# Patient Record
Sex: Female | Born: 1995 | Race: Black or African American | Hispanic: No | Marital: Single | Smoking: Never smoker
Health system: Southern US, Community
[De-identification: ages and names within clinical notes are randomized; demographics above are authoritative.]

## PROBLEM LIST (undated history)

## (undated) DIAGNOSIS — J45909 Unspecified asthma, uncomplicated: Secondary | ICD-10-CM

## (undated) DIAGNOSIS — J302 Other seasonal allergic rhinitis: Secondary | ICD-10-CM

## (undated) HISTORY — PX: NO PAST SURGERIES: SHX2092

---
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
## 2016-04-06 ENCOUNTER — Ambulatory Visit (INDEPENDENT_AMBULATORY_CARE_PROVIDER_SITE_OTHER): Payer: BLUE CROSS/BLUE SHIELD | Admitting: Family Medicine

## 2016-04-06 ENCOUNTER — Other Ambulatory Visit: Payer: Self-pay | Admitting: Family Medicine

## 2016-04-06 ENCOUNTER — Encounter: Payer: Self-pay | Admitting: Family Medicine

## 2016-04-06 VITALS — BP 110/72 | HR 79 | Ht 62.5 in | Wt 121.4 lb

## 2016-04-06 DIAGNOSIS — Z113 Encounter for screening for infections with a predominantly sexual mode of transmission: Secondary | ICD-10-CM

## 2016-04-06 DIAGNOSIS — Z0000 Encounter for general adult medical examination without abnormal findings: Secondary | ICD-10-CM

## 2016-04-06 DIAGNOSIS — J4520 Mild intermittent asthma, uncomplicated: Secondary | ICD-10-CM | POA: Diagnosis not present

## 2016-04-06 LAB — CBC WITH DIFFERENTIAL/PLATELET
Basophils Absolute: 54 cells/uL (ref 0–200)
Basophils Relative: 1 %
Eosinophils Absolute: 270 cells/uL (ref 15–500)
Eosinophils Relative: 5 %
HCT: 37.3 % (ref 35.0–45.0)
Hemoglobin: 12.6 g/dL (ref 11.7–15.5)
Lymphocytes Relative: 44 %
Lymphs Abs: 2376 cells/uL (ref 850–3900)
MCH: 32.2 pg (ref 27.0–33.0)
MCHC: 33.8 g/dL (ref 32.0–36.0)
MCV: 95.4 fL (ref 80.0–100.0)
MPV: 10.4 fL (ref 7.5–12.5)
Monocytes Absolute: 324 cells/uL (ref 200–950)
Monocytes Relative: 6 %
Neutro Abs: 2376 cells/uL (ref 1500–7800)
Neutrophils Relative %: 44 %
Platelets: 311 10*3/uL (ref 140–400)
RBC: 3.91 MIL/uL (ref 3.80–5.10)
RDW: 12.3 % (ref 11.0–15.0)
WBC: 5.4 10*3/uL (ref 4.0–10.5)

## 2016-04-06 LAB — LIPID PANEL
Cholesterol: 159 mg/dL (ref 125–170)
HDL: 59 mg/dL (ref 36–76)
LDL Cholesterol: 87 mg/dL (ref <110)
Total CHOL/HDL Ratio: 2.7 Ratio (ref <=5.0)
Triglycerides: 63 mg/dL (ref 40–136)
VLDL: 13 mg/dL (ref <30)

## 2016-04-06 LAB — HIV ANTIBODY (ROUTINE TESTING W REFLEX): HIV 1&2 Ab, 4th Generation: NONREACTIVE

## 2016-04-06 LAB — POCT URINALYSIS DIPSTICK
Bilirubin, UA: NEGATIVE
Glucose, UA: NEGATIVE
Ketones, UA: NEGATIVE
Leukocytes, UA: NEGATIVE
Nitrite, UA: NEGATIVE
Protein, UA: NEGATIVE
Spec Grav, UA: >=1.03
Urobilinogen, UA: NEGATIVE
pH, UA: 6

## 2016-04-06 MED ORDER — ALBUTEROL SULFATE HFA 108 (90 BASE) MCG/ACT IN AERS: 2.0000 | INHALATION_SPRAY | Freq: Four times a day (QID) | RESPIRATORY_TRACT | 1 refills | Status: DC | PRN

## 2016-04-06 NOTE — Patient Instructions (Addendum)
Return if you are needing your albuterol inhaler more than 2 times per month during the day or more than once at nighttime. This may mean that your asthma in not well controlled and we need to add another medication.    Preventative Care for Adults - Female      MAINTAIN REGULAR HEALTH EXAMS:  A routine yearly physical is a good way to check in with your primary care provider about your health and preventive screening. It is also an opportunity to share updates about your health and any concerns you have, and receive a thorough all-over exam.   Most health insurance companies pay for at least some preventative services.  Check with your health plan for specific coverages.  WHAT PREVENTATIVE SERVICES DO WOMEN NEED?  Adult women should have their weight and blood pressure checked regularly.   Women age 535 and older should have their cholesterol levels checked regularly.  Women should be screened for cervical cancer with a Pap smear and pelvic exam beginning at either age 19, or 3 years after they become sexually activity.    Breast cancer screening generally begins at age 740 with a mammogram and breast exam by your primary care provider.    Beginning at age 19 and continuing to age 19, women should be screened for colorectal cancer.  Certain people may need continued testing until age 19.  Updating vaccinations is part of preventative care.  Vaccinations help protect against diseases such as the flu.  Osteoporosis is a disease in which the bones lose minerals and strength as we age. Women ages 4365 and over should discuss this with their caregivers, as should women after menopause who have other risk factors.  Lab tests are generally done as part of preventative care to screen for anemia and blood disorders, to screen for problems with the kidneys and liver, to screen for bladder problems, to check blood sugar, and to check your cholesterol level.  Preventative services generally include  counseling about diet, exercise, avoiding tobacco, drugs, excessive alcohol consumption, and sexually transmitted infections.    GENERAL RECOMMENDATIONS FOR GOOD HEALTH:  Healthy diet:  Eat a variety of foods, including fruit, vegetables, animal or vegetable protein, such as meat, fish, chicken, and eggs, or beans, lentils, tofu, and grains, such as rice.  Drink plenty of water daily.  Decrease saturated fat in the diet, avoid lots of red meat, processed foods, sweets, fast foods, and fried foods.  Exercise:  Aerobic exercise helps maintain good heart health. At least 30-40 minutes of moderate-intensity exercise is recommended. For example, a brisk walk that increases your heart rate and breathing. This should be done on most days of the week.   Find a type of exercise or a variety of exercises that you enjoy so that it becomes a part of your daily life.  Examples are running, walking, swimming, water aerobics, and biking.  For motivation and support, explore group exercise such as aerobic class, spin class, Zumba, Yoga,or  martial arts, etc.    Set exercise goals for yourself, such as a certain weight goal, walk or run in a race such as a 5k walk/run.  Speak to your primary care provider about exercise goals.  Disease prevention:  If you smoke or chew tobacco, find out from your caregiver how to quit. It can literally save your life, no matter how long you have been a tobacco user. If you do not use tobacco, never begin.   Maintain a healthy diet and  normal weight. Increased weight leads to problems with blood pressure and diabetes.   The Body Mass Index or BMI is a way of measuring how much of your body is fat. Having a BMI above 27 increases the risk of heart disease, diabetes, hypertension, stroke and other problems related to obesity. Your caregiver can help determine your BMI and based on it develop an exercise and dietary program to help you achieve or maintain this important  measurement at a healthful level.  High blood pressure causes heart and blood vessel problems.  Persistent high blood pressure should be treated with medicine if weight loss and exercise do not work.   Fat and cholesterol leaves deposits in your arteries that can block them. This causes heart disease and vessel disease elsewhere in your body.  If your cholesterol is found to be high, or if you have heart disease or certain other medical conditions, then you may need to have your cholesterol monitored frequently and be treated with medication.   Ask if you should have a cardiac stress test if your history suggests this. A stress test is a test done on a treadmill that looks for heart disease. This test can find disease prior to there being a problem.  Menopause can be associated with physical symptoms and risks. Hormone replacement therapy is available to decrease these. You should talk to your caregiver about whether starting or continuing to take hormones is right for you.   Osteoporosis is a disease in which the bones lose minerals and strength as we age. This can result in serious bone fractures. Risk of osteoporosis can be identified using a bone density scan. Women ages 41 and over should discuss this with their caregivers, as should women after menopause who have other risk factors. Ask your caregiver whether you should be taking a calcium supplement and Vitamin D, to reduce the rate of osteoporosis.   Avoid drinking alcohol in excess (more than two drinks per day).  Avoid use of street drugs. Do not share needles with anyone. Ask for professional help if you need assistance or instructions on stopping the use of alcohol, cigarettes, and/or drugs.  Brush your teeth twice a day with fluoride toothpaste, and floss once a day. Good oral hygiene prevents tooth decay and gum disease. The problems can be painful, unattractive, and can cause other health problems. Visit your dentist for a routine oral  and dental check up and preventive care every 6-12 months.   Look at your skin regularly.  Use a mirror to look at your back. Notify your caregivers of changes in moles, especially if there are changes in shapes, colors, a size larger than a pencil eraser, an irregular border, or development of new moles.  Safety:  Use seatbelts 100% of the time, whether driving or as a passenger.  Use safety devices such as hearing protection if you work in environments with loud noise or significant background noise.  Use safety glasses when doing any work that could send debris in to the eyes.  Use a helmet if you ride a bike or motorcycle.  Use appropriate safety gear for contact sports.  Talk to your caregiver about gun safety.  Use sunscreen with a SPF (or skin protection factor) of 15 or greater.  Lighter skinned people are at a greater risk of skin cancer. Don't forget to also wear sunglasses in order to protect your eyes from too much damaging sunlight. Damaging sunlight can accelerate cataract formation.   Practice safe  sex. Use condoms. Condoms are used for birth control and to help reduce the spread of sexually transmitted infections (or STIs).  Some of the STIs are gonorrhea (the clap), chlamydia, syphilis, trichomonas, herpes, HPV (human papilloma virus) and HIV (human immunodeficiency virus) which causes AIDS. The herpes, HIV and HPV are viral illnesses that have no cure. These can result in disability, cancer and death.   Keep carbon monoxide and smoke detectors in your home functioning at all times. Change the batteries every 6 months or use a model that plugs into the wall.   Vaccinations:  Stay up to date with your tetanus shots and other required immunizations. You should have a booster for tetanus every 10 years. Be sure to get your flu shot every year, since 5%-20% of the [place] population comes down with the flu. The flu vaccine changes each year, so being vaccinated once is not enough. Get your  shot in the fall, before the flu season peaks.   Other vaccines to consider:  Human Papilloma Virus or HPV causes cancer of the cervix, and other infections that can be transmitted from person to person. There is a vaccine for HPV, and females should get immunized between the ages of 76 and 38. It requires a series of 3 shots.   Pneumococcal vaccine to protect against certain types of pneumonia.  This is normally recommended for adults age 72 or older.  However, adults younger than 19 years old with certain underlying conditions such as diabetes, heart or lung disease should also receive the vaccine.  Shingles vaccine to protect against Varicella Zoster if you are older than age 56, or younger than 19 years old with certain underlying illness.  Hepatitis A vaccine to protect against a form of infection of the liver by a virus acquired from food.  Hepatitis B vaccine to protect against a form of infection of the liver by a virus acquired from blood or body fluids, particularly if you work in health care.  If you plan to travel internationally, check with your local health department for specific vaccination recommendations.  Cancer Screening:  Breast cancer screening is essential to preventive care for women. All women age 25 and older should perform a breast self-exam every month. At age 15 and older, women should have their caregiver complete a breast exam each year. Women at ages 81 and older should have a mammogram (x-ray film) of the breasts. Your caregiver can discuss how often you need mammograms.    Cervical cancer screening includes taking a Pap smear (sample of cells examined under a microscope) from the cervix (end of the uterus). It also includes testing for HPV (Human Papilloma Virus, which can cause cervical cancer). Screening and a pelvic exam should begin at age 83, or 3 years after a woman becomes sexually active. Screening should occur every year, with a Pap smear but no HPV  testing, up to age 41. After age 19, you should have a Pap smear every 3 years with HPV testing, if no HPV was found previously.   Most routine colon cancer screening begins at the age of 31. On a yearly basis, doctors may provide special easy to use take-home tests to check for hidden blood in the stool. Sigmoidoscopy or colonoscopy can detect the earliest forms of colon cancer and is life saving. These tests use a small camera at the end of a tube to directly examine the colon. Speak to your caregiver about this at age 48, when routine screening  begins (and is repeated every 5 years unless early forms of pre-cancerous polyps or small growths are found).

## 2016-04-06 NOTE — Progress Notes (Signed)
Subjective:    Patient ID: Kendra LopesAngel N Francis, female    DOB: Aug 30, 1995, 20 y.o.   MRN: [id number]  HPI Chief Complaint  Patient presents with  . Other    new pt, no concerns. needs refill on inhaler   She is new to the practice and here for a complete physical exam. Previous medical care: Dr April Gay- pediatrics.  Last CPE: 2 years ago.   Diagnosed with asthma as a child. Dog dander and seasonal allergies and heat triggers her allergies. Takes allegra.   Other providers: dentist Dr .Clare CharonKapec  Past medical history: asthma - night time 2-3 times last week. Allegra. Back pain- chronic for several years- had negative MRI.  Surgeries: none  Family history: parents healthy. DM and HTN in grandparents.   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 Fruits and vegetables Excerise: 2 times per week.   Immunizations: up to date. Thinks she had Gardasil but will check on this.   Health maintenance:  Mammogram: never Colonoscopy: never  Last Gynecological Exam: never Last Menstrual cycle: today Pregnancies: 0 Sexually active: 1 partner Contraception- none Requests STD testing  Last Dental Exam: past week. Dr. Clare CharonKapec Last Eye Exam: years ago  Wears seatbelt always, uses sunscreen, smoke detectors in home and functioning, does not text while driving and feels safe in home environment.   Reviewed allergies, medications, past medical, surgical, family, and social history.    Review of Systems Review of Systems Constitutional: -fever, -chills, -sweats, -unexpected weight change,-fatigue ENT: -runny nose, -ear pain, -sore throat Cardiology:  -chest pain, -palpitations, -edema Respiratory: -cough, -shortness of breath, -wheezing Gastroenterology: -abdominal pain, -nausea, -vomiting, -diarrhea, -constipation  Hematology: -bleeding or bruising problems Musculoskeletal: -arthralgias, -myalgias,  -joint swelling, +back pain, chronic Ophthalmology: -vision changes Urology: -dysuria, -difficulty urinating, -hematuria, -urinary frequency, -urgency Neurology: -headache, -weakness, -tingling, -numbness       Objective:   Physical Exam BP 110/72   Pulse 79   Ht 5' 2.5" (1.588 m)   Wt 121 lb 6.4 oz (55.1 kg)   LMP 03/06/2016   BMI 21.85 kg/m   General Appearance:    Alert, cooperative, no distress, appears stated age  Head:    Normocephalic, without obvious abnormality, atraumatic  Eyes:    PERRL, conjunctiva/corneas clear, EOM's intact, fundi    benign  Ears:    Normal TM's and external ear canals  Nose:   Nares normal, mucosa normal, no drainage or sinus   tenderness  Throat:   Lips, mucosa, and tongue normal; teeth and gums normal  Neck:   Supple, no lymphadenopathy;  thyroid:  no   enlargement/tenderness/nodules; no carotid   bruit or JVD  Back:    Spine nontender, no curvature, ROM normal, no CVA     tenderness  Lungs:     Clear to auscultation bilaterally without wheezes, rales or     ronchi; respirations unlabored  Chest Wall:    No tenderness or deformity   Heart:    Regular rate and rhythm, S1 and S2 normal, no murmur, rub   or gallop  Breast Exam:    Refused.   Abdomen:     Soft, non-tender, nondistended, normoactive bowel sounds,    no masses, no hepatosplenomegaly  Genitalia:    Refused.  Pap not performed  Rectal:    Not performed due to age<40 and no related complaints  Extremities:   No clubbing, cyanosis or  edema  Pulses:   2+ and symmetric all extremities  Skin:   Skin color, texture, turgor normal, no rashes or lesions  Lymph nodes:   Cervical, supraclavicular, and axillary nodes normal  Neurologic:   CNII-XII intact, normal strength, sensation and gait; reflexes 2+ and symmetric throughout          Psych:   Normal mood, affect, hygiene and grooming.    Urinalysis dipstick: spec grav 1.030 , blood 2+ (menses)      Assessment & Plan:  Routine general  medical examination at a health care facility - Plan: CBC with Differential/Platelet, POCT urinalysis dipstick, Lipid panel  Screening for STD (sexually transmitted disease) - Plan: RPR, GC/Chlamydia Probe Amp, HIV antibody  Asthma, mild intermittent, uncomplicated - Plan: albuterol (PROVENTIL HFA;VENTOLIN HFA) 108 (90 Base) MCG/ACT inhaler  Discussed asthma and triggers. Currently her asthma appears to be well controlled but if she notices that she is needing her albuterol inhaler more than twice per month during the day or more than 1 time per month at night and she will let me know. She will then need PFT and possibly a maintenance inhaler.  Discussed safe sex and pregnancy prevention. She will let me know if she decides to be on birth control.  Overall she appears to be quite healthy. Up-to-date on immunizations and health maintenance per patient, no records on file. Discussed safety and health promotion. STD testing done per patient request.  Hematuria due to menses. She will follow-up in one year or pending labs.

## 2016-04-07 LAB — GC/CHLAMYDIA PROBE AMP
CT Probe RNA: NOT DETECTED
GC Probe RNA: NOT DETECTED

## 2016-04-07 LAB — COMPREHENSIVE METABOLIC PANEL
ALT: 19 U/L (ref 5–32)
AST: 23 U/L (ref 12–32)
Albumin: 4.5 g/dL (ref 3.6–5.1)
Alkaline Phosphatase: 53 U/L (ref 47–176)
BUN: 10 mg/dL (ref 7–20)
CO2: 19 mmol/L — ABNORMAL LOW (ref 20–31)
Calcium: 9.3 mg/dL (ref 8.9–10.4)
Chloride: 106 mmol/L (ref 98–110)
Creat: 0.81 mg/dL (ref 0.50–1.00)
Glucose, Bld: 73 mg/dL (ref 65–99)
Potassium: 3.2 mmol/L — ABNORMAL LOW (ref 3.8–5.1)
Sodium: 139 mmol/L (ref 135–146)
Total Bilirubin: 0.5 mg/dL (ref 0.2–1.1)
Total Protein: 7.2 g/dL (ref 6.3–8.2)

## 2016-04-07 LAB — RPR

## 2016-04-08 ENCOUNTER — Other Ambulatory Visit: Payer: Self-pay | Admitting: Family Medicine

## 2016-04-08 DIAGNOSIS — E876 Hypokalemia: Secondary | ICD-10-CM

## 2016-04-10 ENCOUNTER — Other Ambulatory Visit: Payer: BLUE CROSS/BLUE SHIELD

## 2016-04-10 DIAGNOSIS — E876 Hypokalemia: Secondary | ICD-10-CM | POA: Diagnosis not present

## 2016-04-10 LAB — POTASSIUM: Potassium: 4.1 mmol/L (ref 3.8–5.1)

## 2016-04-16 DIAGNOSIS — H5203 Hypermetropia, bilateral: Secondary | ICD-10-CM | POA: Diagnosis not present

## 2016-05-13 ENCOUNTER — Ambulatory Visit (INDEPENDENT_AMBULATORY_CARE_PROVIDER_SITE_OTHER): Payer: BLUE CROSS/BLUE SHIELD | Admitting: Family Medicine

## 2016-05-13 ENCOUNTER — Encounter: Payer: Self-pay | Admitting: Family Medicine

## 2016-05-13 VITALS — BP 130/70 | HR 65 | Temp 98.2°F | Resp 16 | Wt 124.4 lb

## 2016-05-13 DIAGNOSIS — R519 Headache, unspecified: Secondary | ICD-10-CM

## 2016-05-13 DIAGNOSIS — R51 Headache: Secondary | ICD-10-CM | POA: Diagnosis not present

## 2016-05-13 NOTE — Progress Notes (Signed)
Subjective:    Patient ID: Kendra Francis, female    DOB: November 21, 1995, 20 y.o.   MRN: [id number]  HPI Chief Complaint  Patient presents with  . Headache    intermittent onset 4 days ago. sensitive to light and sounds. reports occured 1 week ago and saw dentist- ? if possible wisdom tooth pain.    She is here with complaints of 4 days of headache, bilateral temporal pain but more on left side. Pain is sharp and comes on without warning and lasts 5-10 minutes. States pain is at random times, sometimes she wakes up with it, yesterday is started in the afternoon. States she has headaches 4-5 times per day. She also reports that she was sensitive to light and sound this morning but this only lasted 5-10 minutes this morning as well.  She has not taken any medication for headache. Denies history of migraines.   Denies dizziness, blurred or double vision, tinnitus, nasal congestion, rhinorrhea, sinus pain, ear pain, sore throat, chest pain, cough, shortness of breath. Denies numbness, tingling, weakness.  Has seasonal allergies and takes allegra as needed. Has not been taking this and denies allergy symptoms.   Does not smoke, drink alcohol or use drugs.  Reports being hydrated, minimal caffeine intake and this is unchanged. Reports sleeping well, no change in habits.   States she had a week of similar headaches approximately a month ago. States she went to the dentist after it started and got her teeth cleaned. States she is having issues with her wisdom teeth coming in and questions whether this could be related.  Headaches at that time lasted less than a week per patient.   Reviewed allergies, medications, past medical, surgical, family, and social history.  Review of Systems Pertinent positives and negatives in the history of present illness.     Objective:   Physical Exam  Constitutional: She is oriented to person, place, and time. She appears well-developed and well-nourished. No  distress.  HENT:  Right Ear: Tympanic membrane and ear canal normal.  Left Ear: Tympanic membrane and ear canal normal.  Nose: Nose normal. Right sinus exhibits no maxillary sinus tenderness and no frontal sinus tenderness. Left sinus exhibits no maxillary sinus tenderness and no frontal sinus tenderness.  Mouth/Throat: Uvula is midline, oropharynx is clear and moist and mucous membranes are normal.  No TMJ tenderness or temporal tenderness or fullness  Eyes: Conjunctivae and EOM are normal. Pupils are equal, round, and reactive to light.  Neck: Normal range of motion. Neck supple.  Cardiovascular: Normal rate, regular rhythm, normal heart sounds and normal pulses.  Exam reveals no gallop and no friction rub.   No murmur heard. Pulmonary/Chest: Effort normal and breath sounds normal.  Lymphadenopathy:       Head (right side): No preauricular, no posterior auricular and no occipital adenopathy present.       Head (left side): No preauricular, no posterior auricular and no occipital adenopathy present.    She has no cervical adenopathy.       Right: No supraclavicular adenopathy present.       Left: No supraclavicular adenopathy present.  Neurological: She is alert and oriented to person, place, and time. She has normal strength. No cranial nerve deficit or sensory deficit. Coordination and gait normal.  Skin: Skin is warm and dry. No purpura and no rash noted. No pallor.  Psychiatric: She has a normal mood and affect. Her speech is normal and behavior is normal. Judgment and thought  content normal. Cognition and memory are normal.   BP 130/70   Pulse 65   Temp 98.2 F (36.8 C) (Oral)   Resp 16   Wt 124 lb 6.4 oz (56.4 kg)   LMP 05/06/2016   SpO2 99%   BMI 22.39 kg/m      Assessment & Plan:  Acute nonintractable headache, unspecified headache type  Discussed that her headache symptoms are vague and not worrisome at this point. Neuro exam is normal. Asymptomatic at present. Her  symptoms are not affecting her daily activities.  Recommend that she keep a journal of her headaches over the next few days. What time of day, what she was doing prior, what is felt like, location, how long does it last and what other symptoms if any. Stay well hydrated, do not skip meals, get adequate sleep. She may take aleve or ibuprofen with food if needed but she has taken anything yet because her headaches clear up before oral medication would take effect.   Follow up in 2 weeks or sooner if headaches worsen.

## 2016-05-13 NOTE — Patient Instructions (Signed)
Keep a journal of your headaches over the next few days. What time of day, what were you doing prior, what is felt like, location, how long does it last and what other symptoms are you having if any.  Stay well hydrated, do not skip meals, get adequate sleep.  If you need to take aleve or ibuprofen you can. Take it with food.  Follow up in 2 weeks to see how you are doing.    General Headache Without Cause A headache is pain or discomfort felt around the head or neck area. The specific cause of a headache may not be found. There are many causes and types of headaches. A few common ones are:  Tension headaches.  Migraine headaches.  Cluster headaches.  Chronic daily headaches. HOME CARE INSTRUCTIONS  Watch your condition for any changes. Take these steps to help with your condition: Managing Pain  Take over-the-counter and prescription medicines only as told by your health care provider.  Lie down in a dark, quiet room when you have a headache.  If directed, apply ice to the head and neck area:  Put ice in a plastic bag.  Place a towel between your skin and the bag.  Leave the ice on for 20 minutes, 2-3 times per day.  Use a heating pad or hot shower to apply heat to the head and neck area as told by your health care provider.  Keep lights dim if bright lights bother you or make your headaches worse. Eating and Drinking  Eat meals on a regular schedule.  Limit alcohol use.  Decrease the amount of caffeine you drink, or stop drinking caffeine. General Instructions  Keep all follow-up visits as told by your health care provider. This is important.  Keep a headache journal to help find out what may trigger your headaches. For example, write down:  What you eat and drink.  How much sleep you get.  Any change to your diet or medicines.  Try massage or other relaxation techniques.  Limit stress.  Sit up straight, and do not tense your muscles.  Do not use tobacco  products, including cigarettes, chewing tobacco, or e-cigarettes. If you need help quitting, ask your health care provider.  Exercise regularly as told by your health care provider.  Sleep on a regular schedule. Get 7-9 hours of sleep, or the amount recommended by your health care provider. SEEK MEDICAL CARE IF:   Your symptoms are not helped by medicine.  You have a headache that is different from the usual headache.  You have nausea or you vomit.  You have a fever. SEEK IMMEDIATE MEDICAL CARE IF:   Your headache becomes severe.  You have repeated vomiting.  You have a stiff neck.  You have a loss of vision.  You have problems with speech.  You have pain in the eye or ear.  You have muscular weakness or loss of muscle control.  You lose your balance or have trouble walking.  You feel faint or pass out.  You have confusion.   This information is not intended to replace advice given to you by your health care provider. Make sure you discuss any questions you have with your health care provider.   Document Released: 08/03/2005 Document Revised: 04/24/2015 Document Reviewed: 11/26/2014 [employer] Interactive Patient Education [employer]2016 [employer]

## 2016-05-15 ENCOUNTER — Telehealth: Payer: Self-pay | Admitting: Family Medicine

## 2016-05-15 ENCOUNTER — Emergency Department (HOSPITAL_COMMUNITY)
Admission: EM | Admit: 2016-05-15 | Discharge: 2016-05-15 | Disposition: A | Discharge status: Home / Self Care | Payer: BLUE CROSS/BLUE SHIELD | Attending: Emergency Medicine | Admitting: Emergency Medicine

## 2016-05-15 ENCOUNTER — Encounter (HOSPITAL_COMMUNITY): Payer: Self-pay | Admitting: Nurse Practitioner

## 2016-05-15 DIAGNOSIS — R51 Headache: Secondary | ICD-10-CM | POA: Insufficient documentation

## 2016-05-15 DIAGNOSIS — J45909 Unspecified asthma, uncomplicated: Secondary | ICD-10-CM | POA: Diagnosis not present

## 2016-05-15 DIAGNOSIS — Z7982 Long term (current) use of aspirin: Secondary | ICD-10-CM | POA: Diagnosis not present

## 2016-05-15 DIAGNOSIS — R519 Headache, unspecified: Secondary | ICD-10-CM

## 2016-05-15 MED ORDER — KETOROLAC TROMETHAMINE 30 MG/ML IJ SOLN
30.0000 mg | Freq: Once | INTRAMUSCULAR | Status: AC
  Administered 2016-05-15: 30 mg via INTRAVENOUS
  Filled 2016-05-15: qty 1

## 2016-05-15 MED ORDER — METOCLOPRAMIDE HCL 5 MG/ML IJ SOLN
10.0000 mg | Freq: Once | INTRAMUSCULAR | Status: AC
  Administered 2016-05-15: 10 mg via INTRAVENOUS
  Filled 2016-05-15: qty 2

## 2016-05-15 MED ORDER — DIPHENHYDRAMINE HCL 50 MG/ML IJ SOLN
25.0000 mg | Freq: Once | INTRAMUSCULAR | Status: AC
  Administered 2016-05-15: 25 mg via INTRAVENOUS
  Filled 2016-05-15: qty 1

## 2016-05-15 MED ORDER — SODIUM CHLORIDE 0.9 % IV BOLUS (SEPSIS)
1000.0000 mL | Freq: Once | INTRAVENOUS | Status: AC
  Administered 2016-05-15: 1000 mL via INTRAVENOUS

## 2016-05-15 NOTE — Telephone Encounter (Signed)
Pt mom called back and stated that Amelda tried the cool compress and pt mom told her to take Excedrin  Migraine, and states that it is noting working and that she has been taking aleve for the past couple of days and it has not been working, pt was hearing a ringing noise, vickie stated that the pt needs to go to the ER since they may need to get a CT since this is  new, gave pts mom instructions per vickie.

## 2016-05-15 NOTE — Discharge Instructions (Signed)
Read the information below.  You may return to the Emergency Department at any time for worsening condition or any new symptoms that concern you.  ° °You are having a headache. No specific cause was found today for your headache. It may have been a migraine or other cause of headache. Stress, anxiety, fatigue, and depression are common triggers for headaches. Your headache today does not appear to be life-threatening or require hospitalization, but often the exact cause of headaches is not determined in the emergency department. Therefore, follow-up with your doctor is very important to find out what may have caused your headache, and whether or not you need any further diagnostic testing or treatment. Sometimes headaches can appear benign (not harmful), but then more serious symptoms can develop which should prompt an immediate re-evaluation by your doctor or the emergency department. °SEEK MEDICAL ATTENTION IF: °You develop possible problems with medications prescribed.  °The medications don't resolve your headache, if it recurs , or if you have multiple episodes of vomiting or can't take fluids. °You have a change from the usual headache. °RETURN IMMEDIATELY IF you develop a sudden, severe headache or confusion, become poorly responsive or faint, develop a fever above 100.4F or problem breathing, have a change in speech, vision, swallowing, or understanding, or develop new weakness, numbness, tingling, incoordination, or have a seizure.  °

## 2016-05-15 NOTE — Telephone Encounter (Signed)
Pt's mom, Alycia, called stating that pt says headaches are not any better. Pt is crying today with headache. Mom wants to know what to do. Bring pt in here for appt, take to ER? Spoke to [employer] and she advise to find out if pt has any other symptoms and if she is taking meds as she was instructed at appt and she can apply cool cloth. Mom says pt is taking meds as instructed but she will tell her to use cool cloth or compress on head and face. Pt has jaw pain but no numbness or tingling. They will try this and call back in an hour to let us know how she is doing.

## 2016-05-15 NOTE — ED Provider Notes (Signed)
[facility]ENCY DEPT Provider Note   CSN: [id number] Arrival date & time: 05/15/16  1257     History   Chief Complaint Chief Complaint  Patient presents with  . Headache    HPI Kendra Francis is a 20 y.o. female.  HPI   Patient presents with headache x 6 days. The pain has been intermittent, mostly dull and all over her head, with occasional sharp pains at her left temple.  She has sensitivity to both sound and light.  Had similar headache a few weeks ago that lasted 4 days.  Notes she did fall off the bed and hit her head on a chair about 6 weeks ago but denies LOC and concussive-type symptoms following the fall. Denies N/V.  Denies fevers, recent illness, head trauma, neck pain or stiffness, "worst" headache of life, sudden onset or "thunderclap" headache.  Denies focal neurologic deficits.  LMP Sept 20.    Past Medical History:  Diagnosis Date  . Asthma   . Seasonal allergies     There are no active problems to display for this patient.   History reviewed. No pertinent surgical history.  OB History    No data available       Home Medications    Prior to Admission medications   Medication Sig Start Date End Date Taking? Authorizing Provider  albuterol (PROVENTIL HFA;VENTOLIN HFA) 108 (90 Base) MCG/ACT inhaler Inhale 2 puffs into the lungs every 6 (six) hours as needed. Wheezing or shortness of breath 04/06/16  Yes Avanell Shackleton, NP  aspirin-acetaminophen-caffeine (EXCEDRIN MIGRAINE) 250-250-65 MG tablet Take 1 tablet by mouth every 6 (six) hours as needed for headache.   Yes Historical Provider, MD  Fexofenadine HCl (ALLEGRA PO) Take 1 tablet by mouth daily.    Yes Historical Provider, MD  ibuprofen (ADVIL,MOTRIN) 200 MG tablet Take 200 mg by mouth every 6 (six) hours as needed. Headache or body ache   Yes Historical Provider, MD    Family History History reviewed. No pertinent family history.  Social History Social History  Substance Use Topics  . Smoking  status: Never Smoker  . Smokeless tobacco: Never Used  . Alcohol use No     Allergies   Review of patient's allergies indicates no known allergies.   Review of Systems Review of Systems  All other systems reviewed and are negative.    Physical Exam Updated Vital Signs BP 118/84   Pulse 73   Temp 98.8 F (37.1 C) (Oral)   Resp 18   Ht 5\' 2"  (1.575 m)   Wt 55.6 kg   LMP 05/06/2016   SpO2 100%   BMI 22.41 kg/m   Physical Exam  Constitutional: She appears well-developed and well-nourished. No distress.  HENT:  Head: Normocephalic and atraumatic.  Neck: Neck supple.  Cardiovascular: Normal rate and regular rhythm.   Pulmonary/Chest: Effort normal and breath sounds normal. No respiratory distress. She has no wheezes. She has no rales.  Abdominal: Soft. She exhibits no distension. There is no tenderness. There is no rebound and no guarding.  Neurological: She is alert.  CN II-XII intact, EOMs intact, no pronator drift, grip strengths equal bilaterally; strength 5/5 in all extremities, sensation intact in all extremities; finger to nose, heel to shin, rapid alternating movements normal; gait is normal.     Skin: She is not diaphoretic.  Nursing note and vitals reviewed.    ED Treatments / Results  Labs (all labs ordered are listed, but only abnormal results are displayed)  Labs Reviewed - No data to display  EKG  EKG Interpretation None       Radiology No results found.  Procedures Procedures (including critical care time)  Medications Ordered in ED Medications  diphenhydrAMINE (BENADRYL) injection 25 mg (25 mg Intravenous Given 05/15/16 1500)  ketorolac (TORADOL) 30 MG/ML injection 30 mg (30 mg Intravenous Given 05/15/16 1500)  sodium chloride 0.9 % bolus 1,000 mL (0 mLs Intravenous Stopped 05/15/16 1539)  metoCLOPramide (REGLAN) injection 10 mg (10 mg Intravenous Given 05/15/16 1500)     Initial Impression / Assessment and Plan / ED Course  I have  reviewed the triage vital signs and the nursing notes.  Pertinent labs & imaging results that were available during my care of the patient were reviewed by me and considered in my medical decision making (see chart for details).  Clinical Course    Afebrile, nontoxic patient with what is likely a migraine headache.  No red flags including head trauma, fevers, meningeal signs, focal neurologic deficits.  Migraine cocktail  given with relief.    D/C home with PCP follow up.  Discussed result, findings, treatment, and follow up  with patient.  Pt given return precautions.  Pt verbalizes understanding and agrees with plan.     Final Clinical Impressions(s) / ED Diagnoses   Final diagnoses:  Bad headache    New Prescriptions Discharge Medication List as of 05/15/2016  3:33 PM       Trixie Dredgemily Brandye Inthavong, PA-C 05/15/16 1700    Gerhard Munchobert Lockwood, MD 05/15/16 [phone]

## 2016-05-15 NOTE — ED Triage Notes (Signed)
Pt presents with c/o headache since Sunday. She has tried Excedrin, advil, ice packs with no relief. She saw her PCP Wednesday who advised her to continue advil but the headache persists so mom brought her to ED today. She has no past history of headaches. The headache is all over her head. She denies any vision changes, n/v, fevers, syncope. The pain is increased with lights and noise. She is alert and breathing easily

## 2016-05-16 ENCOUNTER — Emergency Department (HOSPITAL_COMMUNITY): Payer: BLUE CROSS/BLUE SHIELD

## 2016-05-16 ENCOUNTER — Encounter (HOSPITAL_COMMUNITY): Payer: Self-pay | Admitting: Emergency Medicine

## 2016-05-16 ENCOUNTER — Emergency Department (HOSPITAL_COMMUNITY)
Admission: EM | Admit: 2016-05-16 | Discharge: 2016-05-16 | Disposition: A | Discharge status: Home / Self Care | Payer: BLUE CROSS/BLUE SHIELD | Attending: Emergency Medicine | Admitting: Emergency Medicine

## 2016-05-16 DIAGNOSIS — J45909 Unspecified asthma, uncomplicated: Secondary | ICD-10-CM | POA: Insufficient documentation

## 2016-05-16 DIAGNOSIS — Z7982 Long term (current) use of aspirin: Secondary | ICD-10-CM | POA: Insufficient documentation

## 2016-05-16 DIAGNOSIS — R51 Headache: Secondary | ICD-10-CM | POA: Insufficient documentation

## 2016-05-16 DIAGNOSIS — R519 Headache, unspecified: Secondary | ICD-10-CM

## 2016-05-16 LAB — POC URINE PREG, ED: Preg Test, Ur: NEGATIVE

## 2016-05-16 MED ORDER — DEXAMETHASONE SODIUM PHOSPHATE 10 MG/ML IJ SOLN
10.0000 mg | Freq: Once | INTRAMUSCULAR | Status: AC
  Administered 2016-05-16: 10 mg via INTRAVENOUS
  Filled 2016-05-16: qty 1

## 2016-05-16 MED ORDER — PROCHLORPERAZINE EDISYLATE 5 MG/ML IJ SOLN
10.0000 mg | Freq: Once | INTRAMUSCULAR | Status: AC
  Administered 2016-05-16: 10 mg via INTRAVENOUS
  Filled 2016-05-16: qty 2

## 2016-05-16 MED ORDER — DIPHENHYDRAMINE HCL 50 MG/ML IJ SOLN
25.0000 mg | Freq: Once | INTRAMUSCULAR | Status: AC
  Administered 2016-05-16: 25 mg via INTRAVENOUS
  Filled 2016-05-16: qty 1

## 2016-05-16 MED ORDER — KETOROLAC TROMETHAMINE 30 MG/ML IJ SOLN
30.0000 mg | Freq: Once | INTRAMUSCULAR | Status: AC
  Administered 2016-05-16: 30 mg via INTRAVENOUS
  Filled 2016-05-16: qty 1

## 2016-05-16 MED ORDER — SODIUM CHLORIDE 0.9 % IV BOLUS (SEPSIS)
1000.0000 mL | Freq: Once | INTRAVENOUS | Status: AC
  Administered 2016-05-16: 1000 mL via INTRAVENOUS

## 2016-05-16 NOTE — Discharge Instructions (Signed)
Treatment: You can take Tylenol, Advil, Aleve as prescribed over-the-counter for your symptoms. Do not take more than 4000 mg of Tylenol per day and do not exceed 1600 mg of Advil per day. Rest your brain by avoiding screen time on the phone, tablet, computer. Also reduce focused activity such as math, reading, etc.  Follow-up: Please follow-up with your primary care provider on Monday for follow-up and further evaluation and treatment. Please follow-up with neurology as needed for further evaluation. Please return to emergency department if you develop any new or worsening symptoms.

## 2016-05-16 NOTE — ED Triage Notes (Signed)
Seen yesterday for same; given fluids and medication yesterday and felt better; headache has returned this morning. Notes she fell off bed 6 weeks ago and has headaches since then intermittently.

## 2016-05-16 NOTE — ED Notes (Signed)
Pt. Is sitting outside getting some fresh air.  Gait is steady and pt. Is with a friend

## 2016-05-16 NOTE — ED Provider Notes (Signed)
[facility] Provider Note   CSN: [id number] Arrival date & time: 05/16/16  1241     History   Chief Complaint Chief Complaint  Patient presents with  . Headache    HPI Kendra Francis is a 20 y.o. female with history of asthma who presents with acute on intermittent headache for the past 6 weeks. Patient was seen yesterday for headache as well and felt better following headache cocktail, however her headache has returned today. Patient reports temporal sharp pains with intermittent pain to her posterior head as well. Patient has had associated photophobia and phonophobia. Patient also reports generalized fatigue, especially to her bilateral lower extremities. However, she notes that she does not feel that one limb versus the other is more weak. She denies any fevers, neck pain, visual disturbance, dizziness, lightheadedness, nausea, vomiting. Patient fell out of the bed 6 weeks ago and hit her head on a chair. She did not lose consciousness. Patient reports intermittent headaches since this time that have resolved with Advil or on their own. 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 She has not been actively resting her brain. She has tried Advil and Excedrin without relief.     The history is provided by the patient and a parent.    Past Medical History:  Diagnosis Date  . Asthma   . Seasonal allergies     There are no active problems to display for this patient.   History reviewed. No pertinent surgical history.  OB History    No data available       Home Medications    Prior to Admission medications   Medication Sig Start Date End Date Taking? Authorizing Provider  albuterol (PROVENTIL HFA;VENTOLIN HFA) 108 (90 Base) MCG/ACT inhaler Inhale 2 puffs into the lungs every 6 (six) hours as needed. Wheezing or shortness of breath 04/06/16   Avanell Shackleton, NP  aspirin-acetaminophen-caffeine (EXCEDRIN MIGRAINE) [phone] MG tablet Take 1 tablet by mouth every 6  (six) hours as needed for headache.    Historical Provider, MD  Fexofenadine HCl (ALLEGRA PO) Take 1 tablet by mouth daily.     Historical Provider, MD  ibuprofen (ADVIL,MOTRIN) 200 MG tablet Take 200 mg by mouth every 6 (six) hours as needed. Headache or body ache    Historical Provider, MD    Family History History reviewed. No pertinent family history.  Social History Social History  Substance Use Topics  . Smoking status: Never Smoker  . Smokeless tobacco: Never Used  . Alcohol use No     Allergies   Review of patient's allergies indicates no known allergies.   Review of Systems Review of Systems  Constitutional: Positive for fatigue (generalized). Negative for chills and fever.  HENT: Negative for facial swelling and sore throat.   Eyes: Positive for photophobia. Negative for visual disturbance.  Respiratory: Negative for shortness of breath.   Cardiovascular: Negative for chest pain.  Gastrointestinal: Negative for abdominal pain, nausea and vomiting.  Genitourinary: Negative for dysuria.  Musculoskeletal: Negative for back pain and neck pain.  Skin: Negative for rash and wound.  Neurological: Positive for weakness (bilateral LEs) and headaches. Negative for dizziness and light-headedness.  Psychiatric/Behavioral: The patient is not nervous/anxious.      Physical Exam Updated Vital Signs BP 104/64   Pulse 72   Temp 98.5 F (36.9 C) (Oral)   Resp 18   LMP 05/06/2016   SpO2 100%   Physical Exam  Constitutional: She appears well-developed and well-nourished.  No distress.  HENT:  Head: Normocephalic and atraumatic.  Mouth/Throat: Oropharynx is clear and moist. No oropharyngeal exudate.  Eyes: Conjunctivae and EOM are normal. Pupils are equal, round, and reactive to light. Right eye exhibits no discharge. Left eye exhibits no discharge. No scleral icterus.  Neck: Normal range of motion. Neck supple. No thyromegaly present.  Cardiovascular: Normal rate, regular  rhythm, normal heart sounds and intact distal pulses.  Exam reveals no gallop and no friction rub.   No murmur heard. Pulmonary/Chest: Effort normal and breath sounds normal. No stridor. No respiratory distress. She has no wheezes. She has no rales.  Abdominal: Soft. Bowel sounds are normal. She exhibits no distension. There is no tenderness. There is no rebound and no guarding.  Musculoskeletal: She exhibits no edema.  Lymphadenopathy:    She has no cervical adenopathy.  Neurological: She is alert. Coordination normal.  Reflex Scores:      Patellar reflexes are 2+ on the right side and 2+ on the left side. CN 3-12 intact; normal sensation throughout; 5/5 strength in all 4 extremities; equal bilateral grip strength; no ataxia on finger to nose; heel-to-shin normal   Skin: Skin is warm and dry. No rash noted. She is not diaphoretic. No pallor.  Psychiatric: She has a normal mood and affect.  Nursing note and vitals reviewed.    ED Treatments / Results  Labs (all labs ordered are listed, but only abnormal results are displayed) Labs Reviewed  POC URINE PREG, ED    EKG  EKG Interpretation None       Radiology Ct Head Wo Contrast  Result Date: 05/16/2016 CLINICAL DATA:  Intermittent frontal headache for 6 weeks. Status post fall from bed. EXAM: CT HEAD WITHOUT CONTRAST TECHNIQUE: Contiguous axial images were obtained from the base of the skull through the vertex without intravenous contrast. COMPARISON:  None. FINDINGS: Brain: No evidence of acute infarction, hemorrhage, hydrocephalus, extra-axial collection or mass lesion/mass effect. Vascular: No hyperdense vessel or unexpected calcification. Skull: No osseous abnormality. Sinuses/Orbits: Visualized paranasal sinuses are clear. Visualized mastoid sinuses are clear. Visualized orbits demonstrate no focal abnormality. Other: None IMPRESSION: No acute intracranial pathology. Electronically Signed   By: Elige Ko   On: 05/16/2016  17:51    Procedures Procedures (including critical care time)  Medications Ordered in ED Medications  ketorolac (TORADOL) 30 MG/ML injection 30 mg (30 mg Intravenous Given 05/16/16 1710)  prochlorperazine (COMPAZINE) injection 10 mg (10 mg Intravenous Given 05/16/16 1710)  diphenhydrAMINE (BENADRYL) injection 25 mg (25 mg Intravenous Given 05/16/16 1710)  sodium chloride 0.9 % bolus 1,000 mL (1,000 mLs Intravenous New Bag/Given 05/16/16 1710)  dexamethasone (DECADRON) injection 10 mg (10 mg Intravenous Given 05/16/16 1710)     Initial Impression / Assessment and Plan / ED Course  I have reviewed the triage vital signs and the nursing notes.  Pertinent labs & imaging results that were available during my care of the patient were reviewed by me and considered in my medical decision making (see chart for details).  Clinical Course    Pt HA treated and improved while in ED with Toradol, Compazine, Benadryl, Decadron, 1 L fluid bolus.  Presentation is like pts typical HA and non concerning for [facility], [facility], Meningitis, or temporal arteritis. Pt is afebrile with no focal neuro deficits, nuchal rigidity, or change in vision. CT head negative. Her headache is most likely posttraumatic and part of a postconcussive syndrome. Patient advised to reduce screen time and focused activities. She is encouraged to  practice brain rest and is given the next 2 days off of work until she can follow up with PCP. Advised to continue taking ibuprofen, Tylenol, or Aleve, but pt is to follow up with PCP to discuss prophylactic medication if needed. Patient vitals stable throughout ED course and discharged in satisfactory condition. I discussed patient case with Dr. Adela LankFloyd who is patient's management and agrees with plan.  Final Clinical Impressions(s) / ED Diagnoses   Final diagnoses:  Bad headache    New Prescriptions New Prescriptions   No medications on file     Emi Holeslexandra M Shaquanda Graves, PA-C 05/16/16 1839    Melene Planan  Floyd, DO 05/16/16 [phone]

## 2016-05-16 NOTE — ED Notes (Signed)
Refilled pt.'s ice pak and gave to her,

## 2016-05-18 ENCOUNTER — Ambulatory Visit (INDEPENDENT_AMBULATORY_CARE_PROVIDER_SITE_OTHER): Payer: BLUE CROSS/BLUE SHIELD | Admitting: Family Medicine

## 2016-05-18 ENCOUNTER — Encounter: Payer: Self-pay | Admitting: Family Medicine

## 2016-05-18 VITALS — BP 122/72 | HR 77 | Temp 98.3°F | Wt 126.0 lb

## 2016-05-18 DIAGNOSIS — R519 Headache, unspecified: Secondary | ICD-10-CM

## 2016-05-18 DIAGNOSIS — R51 Headache: Secondary | ICD-10-CM | POA: Diagnosis not present

## 2016-05-18 NOTE — Progress Notes (Signed)
   Subjective:    Patient ID: Kendra Francis, female    DOB: 12/22/1995, 20 y.o.   MRN: [id number]  HPI Chief Complaint  Patient presents with  . ER    ER follow-up for migraines   She is here for ED follow up for headaches. Has had a new onset of headaches.  Does not currently have a headache. Denies personal or family history of migraines.   States when headache is present it is located in the bilateral temporal region but more severe on left side. Pain is sharp and comes on without warning and lasts 5-10 minutes. States pain is at random times, sometimes she wakes up with it, yesterday is started in the afternoon. States she has headaches 4-5 times per day. She also reports that she has occasional sensitivity to light and sound.  Denies dizziness, blurred or double vision, tinnitus, nasal congestion, rhinorrhea, sinus pain, ear pain, sore throat, chest pain, cough, shortness of breath. Denies numbness, tingling, weakness.  Has seasonal allergies and takes allegra as needed. Has not been taking this and denies allergy symptoms.   Does not smoke, drink alcohol or use drugs.  Reports being hydrated, minimal caffeine intake and this is unchanged. Reports sleeping well, no change in habits.   She hit her head on an office chair approximately 2 weeks before headaches started but did not have loc and did not have a headache following the incident. Does not think this is related.   Took 3 advil today and no longer has headache.   Reviewed allergies, medications, past medical, surgical, family, and social history.   Review of Systems Pertinent positives and negatives in the history of present illness.     Objective:   Physical Exam BP 122/72   Pulse 77   Temp 98.3 F (36.8 C) (Oral)   Wt 126 lb (57.2 kg)   LMP 05/06/2016   BMI 23.05 kg/m   Alert and oriented and in no acute distress. Not otherwise examined.       Assessment & Plan:  New onset of headaches - Plan: Ambulatory  referral to Neurology  She has no personal or family history of headaches prior to recent onset. Discussed that it is not clear as to what type of headaches she is having. Headache symptoms have components of tension, cluster and migraine. She has had 2 visits to the ED over the weekend and received headache IV cocktail with steroids without resolution.  She did have a negative head CT as well.  She denies any new headache symptoms today compared to my last visit with her.   Discussed patient with Dr. Susann GivensLalonde and agree with plan to send patient for a neurology consult. Will consider sending her for an MRI if she cannot be seen at neurologist in the next 2 weeks. Discussed red flags for headache including vision changes, systemic symptoms, confusion, numbness, tingling or weakness.   Follow up in 1 month or sooner if needed.

## 2016-05-18 NOTE — Patient Instructions (Signed)
We will call you with the MRI information and the neurology referral. In the meantime if you notice any of the red flags that we discussed then go to the ED.

## 2016-05-19 DIAGNOSIS — L309 Dermatitis, unspecified: Secondary | ICD-10-CM | POA: Diagnosis not present

## 2016-05-27 ENCOUNTER — Ambulatory Visit (INDEPENDENT_AMBULATORY_CARE_PROVIDER_SITE_OTHER): Payer: BLUE CROSS/BLUE SHIELD | Admitting: Neurology

## 2016-05-27 ENCOUNTER — Encounter: Payer: Self-pay | Admitting: Neurology

## 2016-05-27 ENCOUNTER — Ambulatory Visit: Payer: BLUE CROSS/BLUE SHIELD | Admitting: Family Medicine

## 2016-05-27 VITALS — BP 104/66 | HR 73 | Ht 62.0 in | Wt 123.0 lb

## 2016-05-27 DIAGNOSIS — R110 Nausea: Secondary | ICD-10-CM | POA: Diagnosis not present

## 2016-05-27 DIAGNOSIS — G43001 Migraine without aura, not intractable, with status migrainosus: Secondary | ICD-10-CM

## 2016-05-27 DIAGNOSIS — G43909 Migraine, unspecified, not intractable, without status migrainosus: Secondary | ICD-10-CM | POA: Insufficient documentation

## 2016-05-27 MED ORDER — PROCHLORPERAZINE MALEATE 10 MG PO TABS: 10.0000 mg | ORAL_TABLET | Freq: Four times a day (QID) | ORAL | 6 refills | Status: DC | PRN

## 2016-05-27 MED ORDER — SUMATRIPTAN SUCCINATE 100 MG PO TABS: 100.0000 mg | ORAL_TABLET | Freq: Once | ORAL | 12 refills | Status: DC | PRN

## 2016-05-27 NOTE — Progress Notes (Signed)
[facility]    Provider:  Dr Lucia Gaskins Referring Provider: Avanell Shackleton, NP Primary Care Physician:  Hetty Blend, NP  CC:  Migraines  HPI:  Kendra Francis is a 20 y.o. female here as a referral from Dr. Suezanne Jacquet for migraines. Here with mother and sister. Mother has migraines. Started a month ago and lasted 4 days. She thought it was from her wisdom teeth she had gone to the dentists but now realized not related. Second headache started end of September and lasted 8 days. No known inciting events except she had hit her head 2 weeks previous to the first headache, she fell off her bed and hit the corner but no LOC or bruising or bleeding or nausea, light bump. The headaches are constant and dull then sharp pains from 5-10 minutes, continuous, sharp pains in the left temporal area, she would cry because the pain so severe in the left temporal area. She described it as a constant sharp pain for 5-10 minutes superimposed on this dull pain all over the head. Light sensitivity, going into a dark room hiding under the covers sitting still helps. Sounds bother her worsens headaches. No vision or hearing changes. No weakness, numbness, tingling. It got to 10/10. She had severe nausea. No vomiting. No aura.   Reviewed notes, labs and imaging from outside physicians, which showed:     CT head 05/16/2016 showed No acute intracranial abnormalities including mass lesion or mass effect, hydrocephalus, extra-axial fluid collection, midline shift, hemorrhage, or acute infarction, large ischemic events (personally reviewed images)  CBC nml, CMP with K+ 3.2, CO2 19 otherwise nml 04/06/2016   Review of Systems: Patient complains of symptoms per HPI as well as the following symptoms: headaches, weakness. Pertinent negatives per HPI. All others negative.   Social History   Social History  . Marital status: Single    Spouse name: N/A  . Number of children: 0  . Years of education: 26    Occupational History  . [employer]    Social History Main Topics  . Smoking status: Never Smoker  . Smokeless tobacco: Never Used  . Alcohol use No  . Drug use: No  . Sexual activity: Yes    Partners: Male    Birth control/ protection: None   Other Topics Concern  . Not on file   Social History Narrative   Lives with mother   Caffeine use: Tea ocass    Family History  Problem Relation Age of Onset  . Migraines Mother     Past Medical History:  Diagnosis Date  . Asthma   . Seasonal allergies     Past Surgical History:  Procedure Laterality Date  . NO PAST SURGERIES      Current Outpatient Prescriptions  Medication Sig Dispense Refill  . albuterol (PROVENTIL HFA;VENTOLIN HFA) 108 (90 Base) MCG/ACT inhaler Inhale 2 puffs into the lungs every 6 (six) hours as needed. Wheezing or shortness of breath 1 Inhaler 1  . aspirin-acetaminophen-caffeine (EXCEDRIN MIGRAINE) 250-250-65 MG tablet Take 1 tablet by mouth every 6 (six) hours as needed for headache.    . Fexofenadine HCl (ALLEGRA PO) Take 1 tablet by mouth daily.     Marland Kitchen ibuprofen (ADVIL,MOTRIN) 200 MG tablet Take 200 mg by mouth every 6 (six) hours as needed. Headache or body ache    . prochlorperazine (COMPAZINE) 10 MG tablet Take 1 tablet (10 mg total) by mouth every 6 (six) hours as needed for nausea or vomiting. Can take  with imitrex for acute migraine. 30 tablet 6  . SUMAtriptan (IMITREX) 100 MG tablet Take 1 tablet (100 mg total) by mouth once as needed. May repeat in 2 hours if headache persists or recurs. 10 tablet 12   No current facility-administered medications for this visit.     Allergies as of 05/27/2016  . (No Known Allergies)    Vitals: BP 104/66 (BP Location: Right Arm, Patient Position: Sitting, Cuff Size: Normal)   Pulse 73   Ht 5\' 2"  (1.575 m)   Wt 55.8 kg (123 lb)   LMP 05/06/2016   BMI 22.50 kg/m  Last Weight:  Wt Readings from Last 1 Encounters:  05/27/16 55.8 kg (123 lb) (41 %,  Z= -0.23)*   * Growth percentiles are based on CDC 2-20 Years data.   Last Height:   Ht Readings from Last 1 Encounters:  05/27/16 5\' 2"  (1.575 m) (18 %, Z= -0.90)*   * Growth percentiles are based on CDC 2-20 Years data.   Physical exam: Exam: Gen: NAD, conversant, well nourised, well groomed                     CV: RRR, no MRG. No Carotid Bruits. No peripheral edema, warm, nontender Eyes: Conjunctivae clear without exudates or hemorrhage  Neuro: Detailed Neurologic Exam  Speech:    Speech is normal; fluent and spontaneous with normal comprehension.  Cognition:    The patient is oriented to person, place, and time;     recent and remote memory intact;     language fluent;     normal attention, concentration,     fund of knowledge Cranial Nerves:    The pupils are equal, round, and reactive to light. The fundi are normal and spontaneous venous pulsations are present. Visual fields are full to finger confrontation. Extraocular movements are intact. Trigeminal sensation is intact and the muscles of mastication are normal. The face is symmetric. The palate elevates in the midline. Hearing intact. Voice is normal. Shoulder shrug is normal. The tongue has normal motion without fasciculations.   Coordination:    Normal finger to nose and heel to shin. Normal rapid alternating movements.   Gait:    Heel-toe and tandem gait are normal.   Motor Observation:    No asymmetry, no atrophy, and no involuntary movements noted. Tone:    Normal muscle tone.    Posture:    Posture is normal. normal erect    Strength:    Strength is V/V in the upper and lower limbs.      Sensation: intact to LT     Reflex Exam:  DTR's:    Deep tendon reflexes in the upper and lower extremities are normal bilaterally.   Toes:    The toes are downgoing bilaterally.   Clonus:    Clonus is absent.       Assessment/Plan:  [age] year old patient with episodic migraine. Discussed migraines,  management, diagnosis.  As far as your medications are concerned, I would like to suggest:   Imitrex and Compazine at onset of migraine. Please take  at the onset of your headache. If it does not improve the symptoms please take additional dose in 2 hours. Do not take more then 2 tablets in 24hrs. Do not take use more then 2 to 3 days in a week. Discussed side effects ar per patient instructions.   Our phone number is [phone]. We also have an after hours call service for  urgent matters and there is a physician on-call for urgent questions. For any emergencies you know to call 911 or go to the nearest emergency room  To prevent or relieve headaches, try the following: Cool Compress. Lie down and place a cool compress on your head.  Avoid headache triggers. If certain foods or odors seem to have triggered your migraines in the past, avoid them. A headache diary might help you identify triggers.  Include physical activity in your daily routine. Try a daily walk or other moderate aerobic exercise.  Manage stress. Find healthy ways to cope with the stressors, such as delegating tasks on your to-do list.  Practice relaxation techniques. Try deep breathing, yoga, massage and visualization.  Eat regularly. Eating regularly scheduled meals and maintaining a healthy diet might help prevent headaches. Also, drink plenty of fluids.  Follow a regular sleep schedule. Sleep deprivation might contribute to headaches Consider biofeedback. With this mind-body technique, you learn to control certain bodily functions - such as muscle tension, heart rate and blood pressure - to prevent headaches or reduce headache pain.    Proceed to emergency room if you experience new or worsening symptoms or symptoms do not resolve, if you have new neurologic symptoms or if headache is severe, or for any concerning symptom.   CC: Hetty BlendVickie Henson, NP  Naomie DeanAntonia Ahern, MD  [facility] [address]  [address] [place], [place] [id number]  Phone [phone] Fax [phone]

## 2016-05-27 NOTE — Patient Instructions (Addendum)
Remember to drink plenty of fluid, eat healthy meals and do not skip any meals. Try to eat protein with a every meal and eat a healthy snack such as fruit or nuts in between meals. Try to keep a regular sleep-wake schedule and try to exercise daily, particularly in the form of walking, 20-30 minutes a day, if you can.   As far as your medications are concerned, I would like to suggest:   Imitrex and Compazine at onset of migraine. Please take  at the onset of your headache. If it does not improve the symptoms please take additional dose in 2 hours. Do not take more then 2 tablets in 24hrs. Do not take use more then 2 to 3 days in a week.  our phone number is [phone]. We also have an after hours call service for urgent matters and there is a physician on-call for urgent questions. For any emergencies you know to call 911 or go to the nearest emergency room  To prevent or relieve headaches, try the following: Cool Compress. Lie down and place a cool compress on your head.  Avoid headache triggers. If certain foods or odors seem to have triggered your migraines in the past, avoid them. A headache diary might help you identify triggers.  Include physical activity in your daily routine. Try a daily walk or other moderate aerobic exercise.  Manage stress. Find healthy ways to cope with the stressors, such as delegating tasks on your to-do list.  Practice relaxation techniques. Try deep breathing, yoga, massage and visualization.  Eat regularly. Eating regularly scheduled meals and maintaining a healthy diet might help prevent headaches. Also, drink plenty of fluids.  Follow a regular sleep schedule. Sleep deprivation might contribute to headaches Consider biofeedback. With this mind-body technique, you learn to control certain bodily functions - such as muscle tension, heart rate and blood pressure - to prevent headaches or reduce headache pain.    Proceed to emergency room if you experience new or  worsening symptoms or symptoms do not resolve, if you have new neurologic symptoms or if headache is severe, or for any concerning symptom.     Sumatriptan tablets What is this medicine? SUMATRIPTAN (soo ma TRIP tan) is used to treat migraines with or without aura. An aura is a strange feeling or visual disturbance that warns you of an attack. It is not used to prevent migraines. This medicine may be used for other purposes; ask your health care provider or pharmacist if you have questions. What should I tell my health care provider before I take this medicine? They need to know if you have any of these conditions: -circulation problems in fingers and toes -diabetes -heart disease -high blood pressure -high cholesterol -history of irregular heartbeat -history of stroke -kidney disease -liver disease -postmenopausal or surgical removal of uterus and ovaries -seizures -smoke tobacco -stomach or intestine problems -an unusual or allergic reaction to sumatriptan, other medicines, foods, dyes, or preservatives -pregnant or trying to get pregnant -breast-feeding How should I use this medicine? Take this medicine by mouth with a glass of water. Follow the directions on the prescription label. This medicine is taken at the first symptoms of a migraine. It is not for everyday use. If your migraine headache returns after one dose, you can take another dose as directed. You must leave at least 2 hours between doses, and do not take more than 100 mg as a single dose. Do not take more than 200 mg total in  any 24 hour period. If there is no improvement at all after the first dose, do not take a second dose without talking to your doctor or health care professional. Do not take your medicine more often than directed. Talk to your pediatrician regarding the use of this medicine in children. Special care may be needed. Overdosage: If you think you have taken too much of this medicine contact a poison  control center or emergency room at once. NOTE: This medicine is only for you. Do not share this medicine with others. What if I miss a dose? This does not apply; this medicine is not for regular use. What may interact with this medicine? Do not take this medicine with any of the following medicines: -cocaine -ergot alkaloids like dihydroergotamine, ergonovine, ergotamine, methylergonovine -feverfew -MAOIs like Carbex, Eldepryl, Marplan, Nardil, and Parnate -other medicines for migraine headache like almotriptan, eletriptan, frovatriptan, naratriptan, rizatriptan, zolmitriptan -tryptophan This medicine may also interact with the following medications: -certain medicines for depression, anxiety, or psychotic disturbances This list may not describe all possible interactions. Give your health care provider a list of all the medicines, herbs, non-prescription drugs, or dietary supplements you use. Also tell them if you smoke, drink alcohol, or use illegal drugs. Some items may interact with your medicine. What should I watch for while using this medicine? Only take this medicine for a migraine headache. Take it if you get warning symptoms or at the start of a migraine attack. It is not for regular use to prevent migraine attacks. You may get drowsy or dizzy. Do not drive, use machinery, or do anything that needs mental alertness until you know how this medicine affects you. To reduce dizzy or fainting spells, do not sit or stand up quickly, especially if you are an older patient. Alcohol can increase drowsiness, dizziness and flushing. Avoid alcoholic drinks. Smoking cigarettes may increase the risk of heart-related side effects from using this medicine. If you take migraine medicines for 10 or more days a month, your migraines may get worse. Keep a diary of headache days and medicine use. Contact your healthcare professional if your migraine attacks occur more frequently. What side effects may I  notice from receiving this medicine? Side effects that you should report to your doctor or health care professional as soon as possible: -allergic reactions like skin rash, itching or hives, swelling of the face, lips, or tongue -bloody or watery diarrhea -hallucination, loss of contact with reality -pain, tingling, numbness in the face, hands, or feet -seizures -signs and symptoms of a blood clot such as breathing problems; changes in vision; chest pain; severe, sudden headache; pain, swelling, warmth in the leg; trouble speaking; sudden numbness or weakness of the face, arm, or leg -signs and symptoms of a dangerous change in heartbeat or heart rhythm like chest pain; dizziness; fast or irregular heartbeat; palpitations, feeling faint or lightheaded; falls; breathing problems -signs and symptoms of a stroke like changes in vision; confusion; trouble speaking or understanding; severe headaches; sudden numbness or weakness of the face, arm, or leg; trouble walking; dizziness; loss of balance or coordination -stomach pain Side effects that usually do not require medical attention (report these to your doctor or health care professional if they continue or are bothersome): -changes in taste -facial flushing -headache -muscle cramps -muscle pain -nausea, vomiting -weak or tired This list may not describe all possible side effects. Call your doctor for medical advice about side effects. You may report side effects to FDA at 1-800-FDA-1088.  Where should I keep my medicine? Keep out of the reach of children. Store at room temperature between 2 and 30 degrees C (36 and 86 degrees F). Throw away any unused medicine after the expiration date. NOTE: This sheet is a summary. It may not cover all possible information. If you have questions about this medicine, talk to your doctor, pharmacist, or health care provider.    2016, Elsevier/Gold Standard. (2015-02-07 17:46:40)  Prochlorperazine  tablets What is this medicine? PROCHLORPERAZINE (proe klor PER a zeen) helps to control severe nausea and vomiting. This medicine is also used to treat schizophrenia. It can also help patients who experience anxiety that is not due to psychological illness. This medicine may be used for other purposes; ask your health care provider or pharmacist if you have questions. What should I tell my health care provider before I take this medicine? They need to know if you have any of these conditions: -blood disorders or disease -dementia -liver disease or jaundice -Parkinson's disease -uncontrollable movement disorder -an unusual or allergic reaction to prochlorperazine, other medicines, foods, dyes, or preservatives -pregnant or trying to get pregnant -breast-feeding How should I use this medicine? Take this medicine by mouth with a glass of water. Follow the directions on the prescription label. Take your doses at regular intervals. Do not take your medicine more often than directed. Do not stop taking this medicine suddenly. This can cause nausea, vomiting, and dizziness. Ask your doctor or health care professional for advice. Talk to your pediatrician regarding the use of this medicine in children. Special care may be needed. While this drug may be prescribed for children as young as 2 years for selected conditions, precautions do apply. Overdosage: If you think you have taken too much of this medicine contact a poison control center or emergency room at once. NOTE: This medicine is only for you. Do not share this medicine with others. What if I miss a dose? If you miss a dose, take it as soon as you can. If it is almost time for your next dose, take only that dose. Do not take double or extra doses. What may interact with this medicine? Do not take this medicine with any of the following medications: -amoxapine -antidepressants like citalopram, escitalopram, fluoxetine, paroxetine, and  sertraline -deferoxamine -dofetilide -maprotiline -tricyclic antidepressants like amitriptyline, clomipramine, imipramine, nortiptyline and others This medicine may also interact with the following medications: -lithium -medicines for pain -phenytoin -propranolol -warfarin This list may not describe all possible interactions. Give your health care provider a list of all the medicines, herbs, non-prescription drugs, or dietary supplements you use. Also tell them if you smoke, drink alcohol, or use illegal drugs. Some items may interact with your medicine. What should I watch for while using this medicine? Visit your doctor or health care professional for regular checks on your progress. You may get drowsy or dizzy. Do not drive, use machinery, or do anything that needs mental alertness until you know how this medicine affects you. Do not stand or sit up quickly, especially if you are an older patient. This reduces the risk of dizzy or fainting spells. Alcohol may interfere with the effect of this medicine. Avoid alcoholic drinks. This medicine can reduce the response of your body to heat or cold. Dress warm in cold weather and stay hydrated in hot weather. If possible, avoid extreme temperatures like saunas, hot tubs, very hot or cold showers, or activities that can cause dehydration such as vigorous exercise. This medicine  can make you more sensitive to the sun. Keep out of the sun. If you cannot avoid being in the sun, wear protective clothing and use sunscreen. Do not use sun lamps or tanning beds/booths. Your mouth may get dry. Chewing sugarless gum or sucking hard candy, and drinking plenty of water may help. Contact your doctor if the problem does not go away or is severe. What side effects may I notice from receiving this medicine? Side effects that you should report to your doctor or health care professional as soon as possible: -blurred vision -breast enlargement in men or women -breast  milk in women who are not breast-feeding -chest pain, fast or irregular heartbeat -confusion, restlessness -dark yellow or brown urine -difficulty breathing or swallowing -dizziness or fainting spells -drooling, shaking, movement difficulty (shuffling walk) or rigidity -fever, chills, sore throat -involuntary or uncontrollable movements of the eyes, mouth, head, arms, and legs -seizures -stomach area pain -unusually weak or tired -unusual bleeding or bruising -yellowing of skin or eyes Side effects that usually do not require medical attention (report to your doctor or health care professional if they continue or are bothersome): -difficulty passing urine -difficulty sleeping -headache -sexual dysfunction -skin rash, or itching This list may not describe all possible side effects. Call your doctor for medical advice about side effects. You may report side effects to FDA at 1-800-FDA-1088. Where should I keep my medicine? Keep out of the reach of children. Store at room temperature between 15 and 30 degrees C (59 and 86 degrees F). Protect from light. Throw away any unused medicine after the expiration date. NOTE: This sheet is a summary. It may not cover all possible information. If you have questions about this medicine, talk to your doctor, pharmacist, or health care provider.    2016, Elsevier/Gold Standard. (2011-12-22 16:59:39)

## 2016-07-16 ENCOUNTER — Encounter: Payer: Self-pay | Admitting: Family Medicine

## 2016-07-16 ENCOUNTER — Ambulatory Visit (INDEPENDENT_AMBULATORY_CARE_PROVIDER_SITE_OTHER): Payer: BLUE CROSS/BLUE SHIELD | Admitting: Family Medicine

## 2016-07-16 VITALS — BP 112/70 | HR 78 | Temp 98.5°F | Ht 62.0 in | Wt 120.0 lb

## 2016-07-16 DIAGNOSIS — F40243 Fear of flying: Secondary | ICD-10-CM | POA: Diagnosis not present

## 2016-07-16 DIAGNOSIS — J069 Acute upper respiratory infection, unspecified: Secondary | ICD-10-CM

## 2016-07-16 MED ORDER — ALPRAZOLAM 0.25 MG PO TABS: 0.2500 mg | ORAL_TABLET | Freq: Two times a day (BID) | ORAL | 0 refills | Status: DC | PRN

## 2016-07-16 NOTE — Progress Notes (Signed)
Subjective:  Kendra Francis is a 20 y.o. female who presents for possible URI.  Symptoms include a 1 week history of sore throat, dry cough, and  nasal congestion. She reports feeling at least 80% improved and the only remaining symptom she has is nasal congestion.    Denies fever, chills, ear pain, sore throat, cough, abdominal pain, N/V.  She states she is getting married in 2 days and would like to have something to help with congestion.   She also complains of a fear of flying and will be flying to [place]. Is requesting medication to help her with her anxiety.   Treatment to date: day quil, Robitussin. .  ? sick contacts.   She does not smoke.   She does not have a history of bronchitis.   No other aggravating or relieving factors.  No other c/o.  The following portions of the patient's history were reviewed and updated as appropriate: allergies, current medications, past family history, past medical history, past social history, past surgical history and problem list.  ROS as in subjective  Past Medical History:  Diagnosis Date  . Asthma   . Seasonal allergies     Objective: Vital signs reviewed BP 112/70   Pulse 78   Temp 98.5 F (36.9 C) (Oral)   Ht 5\' 2"  (1.575 m)   Wt 120 lb (54.4 kg)   SpO2 98%   BMI 21.95 kg/m    General appearance: Alert, WD/WN, no distress, ill appearing                             Skin: warm, no rash, no diaphoresis                           Head: no sinus tenderness                            Eyes: conjunctiva normal, corneas clear, PERRLA                            Ears: pearly TMs, external ear canals normal                          Nose: septum midline, turbinates swollen, with erythema and clear discharge             Mouth/throat: MMM, tongue normal, no pharyngeal erythema                           Neck: supple, no adenopathy, no thyromegaly, nontender                          Heart: RRR, normal S1, S2, no murmurs                 Lungs: clear lung sounds throughout, no wheezes, no rales                Extremities: no edema, nontender      Assessment: Acute URI  Fear of flying - Plan: ALPRAZolam (XANAX) 0.25 MG tablet  Plan:  Medication orders today include: Samples of Norel AD given #6. She may also try flonase and nasal saline spray. Discussed using a humidifier and staying  well hydrated.    Xanax #3 tabs prescribed for her upcoming trip. Advised on avoiding alcohol and potential drowsiness associated.  She will follow up as needed.

## 2016-08-17 NOTE — L&D Delivery Note (Signed)
Patient is a 21 y.o. now G1P1001 s/p NSVD at 5244w3d, who was admitted for SROM@ 0830 on 06/11/17.  Delivery Note At 10:04 PM a viable female was delivered via Vaginal, Spontaneous Delivery (Presentation: Cephalic ;LOA  ).  APGAR:8 , 9; weight 2401gm.  Placenta status: intact  Cord:3 vessel  with the following complications: none.    Anesthesia: epidural  Episiotomy: None Lacerations: superficial labial left (hemostatic Suture Repair: none Est. Blood Loss (mL): 20  Mom to postpartum.  Baby to Couplet care / Skin to Skin.  Head delivered LOA. No nuchal cord present. Shoulder and body delivered in usual fashion. Infant with spontaneous cry, placed on mother's abdomen, dried and bulb suctioned. Cord clamped x 2 after 1-minute delay, and cut by family member. Cord blood drawn. Placenta delivered spontaneously with gentle cord traction. Fundus firm with massage and Pitocin.   Teodoro KilKerrriann S. Minott,  MD Family Medicine Resident PGY-1 06/11/17, 10:22 PM  Patient is a G1 at 6844w3d who was admitted with PROM, prenatal course complicated by dx of IUGR @ 37wks.  She progressed with augmentation via Pitocin.  I was gloved and present for delivery in its entirety.  Second stage of labor progressed, baby delivered after a few contractions.  Mild decels during second stage noted.  Complications: none  Lacerations: none  EBL: 20cc  SHAW, KIMBERLY, CNM 10:46 PM 06/11/2017   Please schedule this patient for PP visit in: 4 weeks Low risk pregnancy complicated by: recent dx IUGR Delivery mode:  SVD Anticipated Birth Control:  other/unsure PP Procedures needed: none  Schedule Integrated BH visit: no Provider: Any provide  Attestation of Attending Supervision of Certified Nurse Midwife: Evaluation and management procedures were performed by the CNM under my supervision.  I have reviewed the CNM's note and chart, and I agree with the management and plan.   Cornelia Copaharlie Porter Moes, Jr MD Attending Center for  [facility] Mid[facility](Faculty Practice)

## 2016-09-28 ENCOUNTER — Telehealth: Payer: Self-pay

## 2016-09-28 ENCOUNTER — Ambulatory Visit: Payer: BLUE CROSS/BLUE SHIELD | Admitting: Neurology

## 2016-09-28 NOTE — Telephone Encounter (Signed)
Pt no-showed her appt this afternoon. 

## 2016-10-02 ENCOUNTER — Encounter: Payer: Self-pay | Admitting: Neurology

## 2016-10-22 DIAGNOSIS — L309 Dermatitis, unspecified: Secondary | ICD-10-CM | POA: Diagnosis not present

## 2016-11-18 DIAGNOSIS — N911 Secondary amenorrhea: Secondary | ICD-10-CM | POA: Diagnosis not present

## 2016-12-09 ENCOUNTER — Ambulatory Visit (INDEPENDENT_AMBULATORY_CARE_PROVIDER_SITE_OTHER): Payer: BLUE CROSS/BLUE SHIELD | Admitting: Family Medicine

## 2016-12-09 ENCOUNTER — Encounter: Payer: Self-pay | Admitting: Family Medicine

## 2016-12-09 VITALS — BP 110/70 | HR 72 | Temp 98.4°F | Wt 123.8 lb

## 2016-12-09 DIAGNOSIS — R21 Rash and other nonspecific skin eruption: Secondary | ICD-10-CM

## 2016-12-09 NOTE — Patient Instructions (Signed)
Use hydrocortisone to the area and try to avoid scratching. Use cool compress for itching. Take Benadryl at bedtime only if needed to control itching.   Let me know if the area gets worse.

## 2016-12-09 NOTE — Progress Notes (Signed)
   Subjective:    Patient ID: Kendra Francis, female    DOB: Jul 23, 1996, 21 y.o.   MRN: [id number]  HPI Chief Complaint  Patient presents with  . itchy bumps    itchy bumps on left ankle. popped up yesterday and get realy swollen and itchy   She is here with complaints of a 1 day history of left ankle itching and red bumps that look like bites from something.   Denies fever, chills, nausea, vomiting, diarrhea. No rash or itching to any other parts. No other concerns.    Review of Systems Pertinent positives and negatives in the history of present illness.     Objective:   Physical Exam  Constitutional: She is oriented to person, place, and time. She appears well-developed and well-nourished. No distress.  Neck: Normal range of motion. Neck supple.  Musculoskeletal: Normal range of motion.       Left ankle: She exhibits normal range of motion, no swelling, no ecchymosis and normal pulse. No tenderness.       Feet:  Neurological: She is alert and oriented to person, place, and time.  Skin: Skin is warm and dry.  3 discreet purplish-reddish flat patches to medial left ankle around the medial malleolus. No warmth, edema, drainage or tenderness.    BP 110/70   Pulse 72   Temp 98.4 F (36.9 C) (Oral)   Wt 123 lb 12.8 oz (56.2 kg)   BMI 22.64 kg/m       Assessment & Plan:  Rash and nonspecific skin eruption  Discussed that she appears to be having a local reaction to an unknown insect bite. No sign of secondary infection. She may use OTC hydrocortisone. Cool compresses for itching or Benadryl at night if needed. Return or call if symptoms worsen or she notices any sign of infection.

## 2017-02-08 ENCOUNTER — Encounter: Payer: Self-pay | Admitting: *Deleted

## 2017-02-08 ENCOUNTER — Ambulatory Visit (INDEPENDENT_AMBULATORY_CARE_PROVIDER_SITE_OTHER): Payer: Medicaid Other | Admitting: Certified Nurse Midwife

## 2017-02-08 ENCOUNTER — Encounter: Payer: Self-pay | Admitting: Certified Nurse Midwife

## 2017-02-08 ENCOUNTER — Other Ambulatory Visit (HOSPITAL_COMMUNITY)
Admission: RE | Admit: 2017-02-08 | Discharge: 2017-02-08 | Disposition: A | Discharge status: Home / Self Care | Payer: Medicaid Other | Source: Ambulatory Visit | Attending: Certified Nurse Midwife | Admitting: Certified Nurse Midwife

## 2017-02-08 VITALS — BP 112/79 | HR 88 | Ht 62.0 in | Wt 128.2 lb

## 2017-02-08 DIAGNOSIS — G8929 Other chronic pain: Secondary | ICD-10-CM

## 2017-02-08 DIAGNOSIS — Z3402 Encounter for supervision of normal first pregnancy, second trimester: Secondary | ICD-10-CM | POA: Insufficient documentation

## 2017-02-08 DIAGNOSIS — O0932 Supervision of pregnancy with insufficient antenatal care, second trimester: Secondary | ICD-10-CM

## 2017-02-08 DIAGNOSIS — M545 Low back pain, unspecified: Secondary | ICD-10-CM

## 2017-02-08 DIAGNOSIS — J4520 Mild intermittent asthma, uncomplicated: Secondary | ICD-10-CM | POA: Insufficient documentation

## 2017-02-08 DIAGNOSIS — O0930 Supervision of pregnancy with insufficient antenatal care, unspecified trimester: Secondary | ICD-10-CM

## 2017-02-08 DIAGNOSIS — Z3490 Encounter for supervision of normal pregnancy, unspecified, unspecified trimester: Secondary | ICD-10-CM | POA: Insufficient documentation

## 2017-02-08 DIAGNOSIS — Z3400 Encounter for supervision of normal first pregnancy, unspecified trimester: Secondary | ICD-10-CM

## 2017-02-08 MED ORDER — PRENATE PIXIE 10-0.6-0.4-200 MG PO CAPS: 1.0000 | ORAL_CAPSULE | Freq: Every day | ORAL | 12 refills | Status: DC

## 2017-02-08 MED ORDER — ALBUTEROL SULFATE HFA 108 (90 BASE) MCG/ACT IN AERS: 2.0000 | INHALATION_SPRAY | Freq: Four times a day (QID) | RESPIRATORY_TRACT | 1 refills | Status: DC | PRN

## 2017-02-08 NOTE — Progress Notes (Signed)
Subjective:    Kendra Francis is being seen today for her first obstetrical visit.  This is a planned pregnancy. She is at 6439w4d gestation. Her obstetrical history is significant for Asthma. Relationship with FOB: significant other, not living together. Patient does intend to breast feed. Pregnancy history fully reviewed.  The information documented in the HPI was reviewed and verified.  Menstrual History: OB History    Gravida Para Term Preterm AB Living   1             SAB TAB Ectopic Multiple Live Births                  Patient's last menstrual period was 09/03/2016 (approximate).    No past medical history on file.  No past surgical history on file.   (Not in a hospital admission) Not on File  Social History  Substance Use Topics  . Smoking status: Not on file  . Smokeless tobacco: Not on file  . Alcohol use Not on file    No family history on file.   Review of Systems Constitutional: negative for weight loss Gastrointestinal: negative for vomiting Genitourinary:negative for genital lesions and vaginal discharge and dysuria Musculoskeletal:negative for back pain Behavioral/Psych: negative for abusive relationship, depression, illegal drug usage and tobacco use    Objective:    BP 112/79   Pulse 88   Ht 5\' 2"  (1.575 m)   Wt 128 lb 3.2 oz (58.2 kg)   LMP 09/03/2016 (Approximate)   BMI 23.45 kg/m  General Appearance:    Alert, cooperative, no distress, appears stated age  Head:    Normocephalic, without obvious abnormality, atraumatic  Eyes:    PERRL, conjunctiva/corneas clear, EOM's intact, fundi    benign, both eyes  Ears:    Normal TM's and external ear canals, both ears  Nose:   Nares normal, septum midline, mucosa normal, no drainage    or sinus tenderness  Throat:   Lips, mucosa, and tongue normal; teeth and gums normal  Neck:   Supple, symmetrical, trachea midline, no adenopathy;    thyroid:  no enlargement/tenderness/nodules; no carotid   bruit or JVD   Back:     Symmetric, no curvature, ROM normal, no CVA tenderness  Lungs:     Clear to auscultation bilaterally, respirations unlabored  Chest Wall:    No tenderness or deformity   Heart:    Regular rate and rhythm, S1 and S2 normal, no murmur, rub   or gallop  Breast Exam:    No tenderness, masses, or nipple abnormality  Abdomen:     Soft, non-tender, bowel sounds active all four quadrants,    no masses, no organomegaly  Genitalia:    Normal female without lesion, discharge or tenderness  Extremities:   Extremities normal, atraumatic, no cyanosis or edema  Pulses:   2+ and symmetric all extremities  Skin:   Skin color, texture, turgor normal, no rashes or lesions  Lymph nodes:   Cervical, supraclavicular, and axillary nodes normal  Neurologic:   CNII-XII intact, normal strength, sensation and reflexes    throughout       Cervix:  Long, thick, closed and posterior.  FHR: 150 by doppler.  FH: 19 cm.     Lab Review Urine pregnancy test Labs reviewed no Radiologic studies reviewed no Assessment:    Pregnancy at 1339w4d weeks    1. Supervision of normal first pregnancy, antepartum  - Culture, OB Urine - Hemoglobinopathy evaluation - Varicella zoster antibody, IgG -  VITAMIN D 25 Hydroxy (Vit-D Deficiency, Fractures) - Cervicovaginal ancillary only - Obstetric Panel, Including HIV - Hemoglobin A1c - TSH Pregnancy - Inheritest Society Guided - MaterniT21 PLUS Core+SCA - [place] MFM OB COMP + 14 WK; Future - Prenat-FeAsp-Meth-FA-DHA w/o A (PRENATE PIXIE) 10-0.6-0.4-200 MG CAPS; Take 1 tablet by mouth daily.  Dispense: 30 capsule; Refill: 12  2. Late prenatal care     @22  weeks by LMP - [place] MFM OB COMP + 14 WK; Future  3. Mild intermittent asthma without complication     - albuterol (PROVENTIL HFA;VENTOLIN HFA) 108 (90 Base) MCG/ACT inhaler; Inhale 2 puffs into the lungs every 6 (six) hours as needed for wheezing or shortness of breath.  Dispense: 18 g; Refill: 1 4. Chronic back pain      -PT referral sent    Prenatal vitamins.  Counseling provided regarding continued use of seat belts, cessation of alcohol consumption, smoking or use of illicit drugs; infection precautions i.e., influenza/TDAP immunizations, toxoplasmosis,CMV, parvovirus, listeria and varicella; workplace safety, exercise during pregnancy; routine dental care, safe medications, sexual activity, hot tubs, saunas, pools, travel, caffeine use, fish and methlymercury, potential toxins, hair treatments, varicose veins Weight gain recommendations per IOM guidelines reviewed: underweight/BMI< 18.5--> gain 28 - 40 lbs; normal weight/BMI 18.5 - 24.9--> gain 25 - 35 lbs; overweight/BMI 25 - 29.9--> gain 15 - 25 lbs; obese/BMI >30->gain  11 - 20 lbs Problem list reviewed and updated. FIRST/CF mutation testing/NIPT/QUAD SCREEN/fragile X/Ashkenazi Jewish population testing/Spinal muscular atrophy discussed: ordered. Role of ultrasound in pregnancy discussed; fetal survey: ordered. Amniocentesis discussed: not indicated.  Meds ordered this encounter  Medications  . acetaminophen (TYLENOL) 500 MG tablet    Sig: Take 500 mg by mouth every 6 (six) hours as needed.  . fexofenadine (ALLEGRA) 180 MG tablet    Sig: Take 180 mg by mouth daily.  . Prenatal Vit-Fe Fumarate-FA (MULTIVITAMIN-PRENATAL) 27-0.8 MG TABS tablet    Sig: Take 1 tablet by mouth daily at 12 noon.  . Prenat-FeAsp-Meth-FA-DHA w/o A (PRENATE PIXIE) 10-0.6-0.4-200 MG CAPS    Sig: Take 1 tablet by mouth daily.    Dispense:  30 capsule    Refill:  12    Please process coupon: Rx BIN: [id number], RxPCN: [id number], RxGRP: [id number], RxID: [id number]  SUF: 01  . albuterol (PROVENTIL HFA;VENTOLIN HFA) 108 (90 Base) MCG/ACT inhaler    Sig: Inhale 2 puffs into the lungs every 6 (six) hours as needed for wheezing or shortness of breath.    Dispense:  18 g    Refill:  1   Orders Placed This Encounter  Procedures  . Culture, OB Urine  . [place] MFM OB COMP + 14 WK    Standing  Status:   Future    Standing Expiration Date:   04/10/2018    Order Specific Question:   Reason for Exam (SYMPTOM  OR DIAGNOSIS REQUIRED)    Answer:   fetal anatomy scan, dating, late prenatal care    Order Specific Question:   Preferred imaging location?    Answer:   [facility]ound  . Hemoglobinopathy evaluation  . Varicella zoster antibody, IgG  . VITAMIN D 25 Hydroxy (Vit-D Deficiency, Fractures)  . Obstetric Panel, Including HIV  . Hemoglobin A1c  . TSH Pregnancy  . Inheritest Society Guided  . MaterniT21 PLUS Core+SCA    Order Specific Question:   Is the patient insulin dependent?    Answer:   No    Order Specific Question:   Please enter gestational age. This  should be expressed as weeks AND days, i.e. 16w 6d. Enter weeks here. Enter days in next question.    Answer:   3    Order Specific Question:   Please enter gestational age. This should be expressed as weeks AND days, i.e. 16w 6d. Enter days here. Enter weeks in previous question.    Answer:   4    Order Specific Question:   How was gestational age calculated?    Answer:   LMP    Order Specific Question:   Please give the date of LMP OR Ultrasound OR Estimated date of delivery.    Answer:   06/10/2017    Order Specific Question:   Number of Fetuses (Type of Pregnancy):    Answer:   1    Order Specific Question:   Indications for performing the test? (please choose all that apply):    Answer:   Routine screening    Order Specific Question:   Other Indications? (Y=Yes, N=No)    Answer:   Y    Order Specific Question:   Please specify other indications, if any:    Answer:   late prenatal care    Order Specific Question:   If this is a repeat specimen, please indicate the reason:    Answer:   Not indicated    Order Specific Question:   Please specify the patient's race: (C=White/Caucasion, B=Black, I=Native American, A=Asian, H=Hispanic, O=Other, U=Unknown)    Answer:   B    Order Specific Question:   Donor Egg - indicate  if the egg was obtained from in vitro fertilization.    Answer:   N    Order Specific Question:   Age of Egg Donor.    Answer:   89    Order Specific Question:   Prior Down Syndrome/ONTD screening during current pregnancy.    Answer:   N    Order Specific Question:   Prior First Trimester Testing    Answer:   N    Order Specific Question:   Prior Second Trimester Testing    Answer:   N    Order Specific Question:   Family History of Neural Tube Defects    Answer:   N    Order Specific Question:   Prior Pregnancy with Down Syndrome    Answer:   N    Order Specific Question:   Please give the patient's weight (in pounds)    Answer:   128    Follow up in 2 weeks. 50% of 30 min visit spent on counseling and coordination of care.

## 2017-02-09 LAB — CERVICOVAGINAL ANCILLARY ONLY
Bacterial vaginitis: NEGATIVE
Candida vaginitis: NEGATIVE
Chlamydia: NEGATIVE
Neisseria Gonorrhea: NEGATIVE
Trichomonas: NEGATIVE

## 2017-02-10 LAB — OBSTETRIC PANEL, INCLUDING HIV
Antibody Screen: NEGATIVE
Basophils Absolute: 0 10*3/uL (ref 0.0–0.2)
Basos: 1 %
EOS (ABSOLUTE): 0.2 10*3/uL (ref 0.0–0.4)
Eos: 3 %
HIV Screen 4th Generation wRfx: NONREACTIVE
Hematocrit: 37.4 % (ref 34.0–46.6)
Hemoglobin: 13 g/dL (ref 11.1–15.9)
Hepatitis B Surface Ag: NEGATIVE
Immature Grans (Abs): 0 10*3/uL (ref 0.0–0.1)
Immature Granulocytes: 0 %
Lymphocytes Absolute: 1.6 10*3/uL (ref 0.7–3.1)
Lymphs: 27 %
MCH: 33.2 pg — ABNORMAL HIGH (ref 26.6–33.0)
MCHC: 34.8 g/dL (ref 31.5–35.7)
MCV: 96 fL (ref 79–97)
Monocytes Absolute: 0.4 10*3/uL (ref 0.1–0.9)
Monocytes: 6 %
Neutrophils Absolute: 3.7 10*3/uL (ref 1.4–7.0)
Neutrophils: 63 %
Platelets: 319 10*3/uL (ref 150–379)
RBC: 3.91 x10E6/uL (ref 3.77–5.28)
RDW: 13.2 % (ref 12.3–15.4)
RPR Ser Ql: NONREACTIVE
Rh Factor: POSITIVE
Rubella Antibodies, IGG: 18.4 index (ref >0.99)
WBC: 5.8 10*3/uL (ref 3.4–10.8)

## 2017-02-10 LAB — HEMOGLOBIN A1C
Est. average glucose Bld gHb Est-mCnc: 85 mg/dL
Hgb A1c MFr Bld: 4.6 % — ABNORMAL LOW (ref 4.8–5.6)

## 2017-02-10 LAB — TSH PREGNANCY: TSH Pregnancy: 2.63 u[IU]/mL (ref 0.450–4.500)

## 2017-02-10 LAB — HEMOGLOBINOPATHY EVALUATION
HGB C: 0 %
HGB S: 0 %
HGB VARIANT: 0 %
Hemoglobin A2 Quantitation: 2.7 % (ref 1.8–3.2)
Hemoglobin F Quantitation: 1.3 % (ref 0.0–2.0)
Hgb A: 96 % — ABNORMAL LOW (ref 96.4–98.8)

## 2017-02-10 LAB — URINE CULTURE, OB REFLEX

## 2017-02-10 LAB — VITAMIN D 25 HYDROXY (VIT D DEFICIENCY, FRACTURES): Vit D, 25-Hydroxy: 12.8 ng/mL — ABNORMAL LOW (ref 30.0–100.0)

## 2017-02-10 LAB — VARICELLA ZOSTER ANTIBODY, IGG: Varicella zoster IgG: 2043 index (ref >165)

## 2017-02-10 LAB — CULTURE, OB URINE

## 2017-02-11 ENCOUNTER — Other Ambulatory Visit: Payer: Self-pay | Admitting: Certified Nurse Midwife

## 2017-02-11 DIAGNOSIS — R7989 Other specified abnormal findings of blood chemistry: Secondary | ICD-10-CM | POA: Insufficient documentation

## 2017-02-11 MED ORDER — VITAMIN D (ERGOCALCIFEROL) 1.25 MG (50000 UNIT) PO CAPS: 50000.0000 [IU] | ORAL_CAPSULE | ORAL | 2 refills | Status: DC

## 2017-02-13 LAB — MATERNIT21 PLUS CORE+SCA
Chromosome 13: NEGATIVE
Chromosome 18: NEGATIVE
Chromosome 21: NEGATIVE
Y Chromosome: NOT DETECTED

## 2017-02-15 ENCOUNTER — Ambulatory Visit (HOSPITAL_COMMUNITY): Payer: Medicaid Other

## 2017-02-16 ENCOUNTER — Other Ambulatory Visit: Payer: Self-pay | Admitting: Certified Nurse Midwife

## 2017-02-16 DIAGNOSIS — Z3400 Encounter for supervision of normal first pregnancy, unspecified trimester: Secondary | ICD-10-CM

## 2017-02-18 ENCOUNTER — Ambulatory Visit (HOSPITAL_COMMUNITY)
Admission: RE | Admit: 2017-02-18 | Discharge: 2017-02-18 | Disposition: A | Discharge status: Home / Self Care | Payer: Medicaid Other | Source: Ambulatory Visit | Attending: Certified Nurse Midwife | Admitting: Certified Nurse Midwife

## 2017-02-18 DIAGNOSIS — O99512 Diseases of the respiratory system complicating pregnancy, second trimester: Secondary | ICD-10-CM | POA: Diagnosis not present

## 2017-02-18 DIAGNOSIS — J45909 Unspecified asthma, uncomplicated: Secondary | ICD-10-CM | POA: Insufficient documentation

## 2017-02-18 DIAGNOSIS — Z3689 Encounter for other specified antenatal screening: Secondary | ICD-10-CM | POA: Diagnosis not present

## 2017-02-18 DIAGNOSIS — O0930 Supervision of pregnancy with insufficient antenatal care, unspecified trimester: Secondary | ICD-10-CM | POA: Diagnosis present

## 2017-02-18 DIAGNOSIS — Z3A22 22 weeks gestation of pregnancy: Secondary | ICD-10-CM | POA: Diagnosis not present

## 2017-02-18 DIAGNOSIS — O0932 Supervision of pregnancy with insufficient antenatal care, second trimester: Secondary | ICD-10-CM | POA: Diagnosis not present

## 2017-02-18 DIAGNOSIS — Z3400 Encounter for supervision of normal first pregnancy, unspecified trimester: Secondary | ICD-10-CM

## 2017-02-19 LAB — INHERITEST SOCIETY GUIDED

## 2017-02-22 ENCOUNTER — Other Ambulatory Visit: Payer: Self-pay | Admitting: Certified Nurse Midwife

## 2017-02-22 ENCOUNTER — Encounter: Payer: Self-pay | Admitting: Obstetrics

## 2017-02-22 DIAGNOSIS — Z3400 Encounter for supervision of normal first pregnancy, unspecified trimester: Secondary | ICD-10-CM

## 2017-02-22 NOTE — Progress Notes (Signed)
Patient notified of results and Rx. 

## 2017-02-23 ENCOUNTER — Ambulatory Visit (INDEPENDENT_AMBULATORY_CARE_PROVIDER_SITE_OTHER): Payer: Medicaid Other | Admitting: Certified Nurse Midwife

## 2017-02-23 ENCOUNTER — Encounter: Payer: Self-pay | Admitting: Certified Nurse Midwife

## 2017-02-23 VITALS — BP 115/78 | HR 79 | Wt 133.0 lb

## 2017-02-23 DIAGNOSIS — Z3402 Encounter for supervision of normal first pregnancy, second trimester: Secondary | ICD-10-CM

## 2017-02-23 DIAGNOSIS — R7989 Other specified abnormal findings of blood chemistry: Secondary | ICD-10-CM

## 2017-02-23 DIAGNOSIS — Z3400 Encounter for supervision of normal first pregnancy, unspecified trimester: Secondary | ICD-10-CM

## 2017-02-23 NOTE — Patient Instructions (Addendum)
AREA PEDIATRIC/FAMILY PRACTICE PHYSICIANS  [facility] [address], [address] [place], [place]  [address] Phone - [phone]   Fax - [phone]  [facility] [address] [place], [place] [address] Phone - [phone]   Fax - [phone]  [facility] [address] [place], [place]  [address] Phone - [phone]   Fax - [phone]  [facility] [address] [place], [place]  [address] Phone - [phone]   Fax - [phone]  [facility] [address] [place], [place]  [address] Phone - [phone]   Fax - [phone]  [facility] [address] [place], [place]  [address] Phone - [phone]   Fax - [phone]  [facility] [address] [place], [place]  [address] Phone - [phone]   Fax - [phone]  [facility] [address] [place], [place]  [address] Phone - [phone]   Fax - [phone]  [facility] [address] [place], [place]  [address] Phone - [phone]   Fax - [phone] [facility] [address] [place], [place]  [address] Phone - [phone]   Fax - [phone]  [facility] [address] [place], [place]  [address] Phone - [phone]   Fax - [phone]  [facility] [address] [place], [place]  [address] Phone - [phone]   Fax - [phone]  [facility] [address] [place], [place]  [address] Phone - [phone]   Fax - [phone]  [facility] [address] [place], [place]  [address] Phone - [phone]  [facility] [address] [place], [place]  [address] Phone - [phone]   Fax - [phone]  [facility] [address] [place], [place]  [address] Phone - [phone]   Fax - [phone]  [facility] [address] [place], [place]  [address] Phone - [phone]   Fax - [phone]  [facility] [address] [place], [place]  [address] Phone - [phone]   Fax - [phone]   [facility] [address] [place], [place]  [address] Phone - [phone]   Fax - [phone]  [facility] [address] [place], [place]  [address] Phone - [phone]   Fax - [phone]  [facility] [address] [place], [place]  [address] Phone - [phone]   Fax - [phone]  [facility] [address] [place], [place]  [address] Phone - [phone]   Fax - [phone]  [facility] [address] [place], [place]  [address] Phone - [phone]   Fax - [phone] [facility] [address] [place], [place]  [address] Phone - [phone]   Fax - [phone]  [facility] [address] [place], [place]  [address] Phone - [phone]   Fax - [phone]  [facility] [address] [place], [place]  [address] Phone - [phone]   Fax - [phone]  [facility] Charlene Flemming MD [address] [place]  [address] Phone [phone]  Fax [phone]  Contraception Choices Contraception (birth control) is the use of any methods or devices to prevent   pregnancy. Below are some methods to help avoid pregnancy. Hormonal methods  Contraceptive implant. This is a thin, plastic tube containing progesterone hormone. It does not contain estrogen hormone. Your health care provider inserts the tube in the inner part of the upper arm. The tube can remain in place for up to 3 years. After 3 years, the implant must be removed. The implant prevents the  ovaries from releasing an egg (ovulation), thickens the cervical mucus to prevent sperm from entering the uterus, and thins the lining of the inside of the uterus.  Progesterone-only injections. These injections are given every 3 months by your health care provider to prevent pregnancy. This synthetic progesterone hormone stops the ovaries from releasing eggs. It also thickens cervical mucus and changes the uterine lining. This makes it harder for sperm to survive in the uterus.  Birth control pills. These pills contain estrogen and progesterone hormone. They work by preventing the ovaries from releasing eggs (ovulation). They also cause the cervical mucus to thicken, preventing the sperm from entering the uterus. Birth control pills are prescribed by a health care provider.Birth control pills can also be used to treat heavy periods.  Minipill. This type of birth control pill contains only the progesterone hormone. They are taken every day of each month and must be prescribed by your health care provider.  Birth control patch. The patch contains hormones similar to those in birth control pills. It must be changed once a week and is prescribed by a health care provider.  Vaginal ring. The ring contains hormones similar to those in birth control pills. It is left in the vagina for 3 weeks, removed for 1 week, and then a new one is put back in place. The patient must be comfortable inserting and removing the ring from the vagina.A health care provider's prescription is necessary.  Emergency contraception. Emergency contraceptives prevent pregnancy after unprotected sexual intercourse. This pill can be taken right after sex or up to 5 days after unprotected sex. It is most effective the sooner you take the pills after having sexual intercourse. Most emergency contraceptive pills are available without a prescription. Check with your pharmacist. Do not use emergency contraception as your only form of birth  control. Barrier methods  Female condom. This is a thin sheath (latex or rubber) that is worn over the penis during sexual intercourse. It can be used with spermicide to increase effectiveness.  Female condom. This is a soft, loose-fitting sheath that is put into the vagina before sexual intercourse.  Diaphragm. This is a soft, latex, dome-shaped barrier that must be fitted by a health care provider. It is inserted into the vagina, along with a spermicidal jelly. It is inserted before intercourse. The diaphragm should be left in the vagina for 6 to 8 hours after intercourse.  Cervical cap. This is a round, soft, latex or plastic cup that fits over the cervix and must be fitted by a health care provider. The cap can be left in place for up to 48 hours after intercourse.  Sponge. This is a soft, circular piece of polyurethane foam. The sponge has spermicide in it. It is inserted into the vagina after wetting it and before sexual intercourse.  Spermicides. These are chemicals that kill or block sperm from entering the cervix and uterus. They come in the form of creams, jellies, suppositories, foam, or tablets. They do not require a prescription. They are inserted into the vagina with an applicator before having sexual intercourse. The process   must be repeated every time you have sexual intercourse. Intrauterine contraception  Intrauterine device (IUD). This is a T-shaped device that is put in a woman's uterus during a menstrual period to prevent pregnancy. There are 2 types: ? Copper IUD. This type of IUD is wrapped in copper wire and is placed inside the uterus. Copper makes the uterus and fallopian tubes produce a fluid that kills sperm. It can stay in place for 10 years. ? Hormone IUD. This type of IUD contains the hormone progestin (synthetic progesterone). The hormone thickens the cervical mucus and prevents sperm from entering the uterus, and it also thins the uterine lining to prevent  implantation of a fertilized egg. The hormone can weaken or kill the sperm that get into the uterus. It can stay in place for 3-5 years, depending on which type of IUD is used. Permanent methods of contraception  Female tubal ligation. This is when the woman's fallopian tubes are surgically sealed, tied, or blocked to prevent the egg from traveling to the uterus.  Hysteroscopic sterilization. This involves placing a small coil or insert into each fallopian tube. Your doctor uses a technique called hysteroscopy to do the procedure. The device causes scar tissue to form. This results in permanent blockage of the fallopian tubes, so the sperm cannot fertilize the egg. It takes about 3 months after the procedure for the tubes to become blocked. You must use another form of birth control for these 3 months.  Female sterilization. This is when the female has the tubes that carry sperm tied off (vasectomy).This blocks sperm from entering the vagina during sexual intercourse. After the procedure, the man can still ejaculate fluid (semen). Natural planning methods  Natural family planning. This is not having sexual intercourse or using a barrier method (condom, diaphragm, cervical cap) on days the woman could become pregnant.  Calendar method. This is keeping track of the length of each menstrual cycle and identifying when you are fertile.  Ovulation method. This is avoiding sexual intercourse during ovulation.  Symptothermal method. This is avoiding sexual intercourse during ovulation, using a thermometer and ovulation symptoms.  Post-ovulation method. This is timing sexual intercourse after you have ovulated. Regardless of which type or method of contraception you choose, it is important that you use condoms to protect against the transmission of sexually transmitted infections (STIs). Talk with your health care provider about which form of contraception is most appropriate for you. This information is not  intended to replace advice given to you by your health care provider. Make sure you discuss any questions you have with your health care provider. Document Released: 08/03/2005 Document Revised: 01/09/2016 Document Reviewed: 01/26/2013 Elsevier Interactive Patient Education  2017 [employer]  

## 2017-02-23 NOTE — Progress Notes (Signed)
   PRENATAL VISIT NOTE  Subjective:  Kendra Francis is a 21 y.o. G1P0 at 6638w0d being seen today for ongoing prenatal care.  She is currently monitored for the following issues for this low-risk pregnancy and has Encounter for supervision of normal pregnancy, unspecified, unspecified trimester; Late prenatal care; Chronic bilateral low back pain without sciatica; Mild intermittent asthma without complication; and Low vitamin D level on her problem list.  Patient reports no complaints.  Contractions: Not present. Vag. Bleeding: None.  Movement: Present. Denies leaking of fluid.   The following portions of the patient's history were reviewed and updated as appropriate: allergies, current medications, past family history, past medical history, past social history, past surgical history and problem list. Problem list updated.  Objective:   Vitals:   02/23/17 1310  BP: 115/78  Pulse: 79  Weight: 133 lb (60.3 kg)    Fetal Status: Fetal Heart Rate (bpm): 148 Fundal Height: 23 cm Movement: Present     General:  Alert, oriented and cooperative. Patient is in no acute distress.  Skin: Skin is warm and dry. No rash noted.   Cardiovascular: Normal heart rate noted  Respiratory: Normal respiratory effort, no problems with respiration noted  Abdomen: Soft, gravid, appropriate for gestational age. Pain/Pressure: Absent     Pelvic:  Cervical exam deferred        Extremities: Normal range of motion.     Mental Status: Normal mood and affect. Normal behavior. Normal judgment and thought content.   Assessment and Plan:  Pregnancy: G1P0 at 5038w0d  1. Supervision of normal first pregnancy, antepartum      Doing well  2. Low vitamin D level     Taking vitamin D.   Preterm labor symptoms and general obstetric precautions including but not limited to vaginal bleeding, contractions, leaking of fluid and fetal movement were reviewed in detail with the patient. Please refer to After Visit Summary for  other counseling recommendations.  Return in about 4 weeks (around 03/23/2017) for ROB, 2 hr OGTT.   Roe Coombsachelle A Harue Pribble, CNM

## 2017-03-05 ENCOUNTER — Encounter: Payer: Self-pay | Admitting: Family Medicine

## 2017-03-05 ENCOUNTER — Ambulatory Visit (INDEPENDENT_AMBULATORY_CARE_PROVIDER_SITE_OTHER): Payer: BLUE CROSS/BLUE SHIELD | Admitting: Family Medicine

## 2017-03-05 VITALS — BP 100/60 | HR 74 | Wt 133.0 lb

## 2017-03-05 DIAGNOSIS — R21 Rash and other nonspecific skin eruption: Secondary | ICD-10-CM

## 2017-03-05 MED ORDER — TRIAMCINOLONE ACETONIDE 0.1 % EX CREA: 1.0000 "application " | TOPICAL_CREAM | Freq: Two times a day (BID) | CUTANEOUS | 0 refills | Status: DC

## 2017-03-05 NOTE — Progress Notes (Signed)
   Subjective:    Patient ID: Kendra Francis, female    DOB: [birth year], 21 y.o.   MRN: [id number]  HPI She was at the beach and over the last week when she returned she has noticed a rash present in both antecubital fossa and also in the bra strap line. It is slightly pruritic   Review of Systems     Objective:   Physical Exam Exam of both antecubital fossa does show a very fine follicular nonerythematous rash. She also has similar appearing lesions in the areas of both bra straps.       Assessment & Plan:  Rash and nonspecific skin eruption - Plan: triamcinolone cream (KENALOG) 0.1 % She will call if no improvement in this. Did instruct her to use the cream sparingly.

## 2017-03-23 ENCOUNTER — Other Ambulatory Visit: Payer: Medicaid Other

## 2017-03-23 ENCOUNTER — Encounter: Payer: Self-pay | Admitting: Certified Nurse Midwife

## 2017-03-23 ENCOUNTER — Ambulatory Visit (INDEPENDENT_AMBULATORY_CARE_PROVIDER_SITE_OTHER): Payer: Medicaid Other | Admitting: Certified Nurse Midwife

## 2017-03-23 VITALS — BP 119/78 | HR 83 | Wt 137.2 lb

## 2017-03-23 DIAGNOSIS — Z3400 Encounter for supervision of normal first pregnancy, unspecified trimester: Secondary | ICD-10-CM

## 2017-03-23 DIAGNOSIS — R7989 Other specified abnormal findings of blood chemistry: Secondary | ICD-10-CM

## 2017-03-23 DIAGNOSIS — Z3402 Encounter for supervision of normal first pregnancy, second trimester: Secondary | ICD-10-CM

## 2017-03-23 DIAGNOSIS — E559 Vitamin D deficiency, unspecified: Secondary | ICD-10-CM

## 2017-03-23 NOTE — Progress Notes (Signed)
   PRENATAL VISIT NOTE  Subjective:  Kendra Francis is a 21 y.o. G1P0 at 3079w0d being seen today for ongoing prenatal care.  She is currently monitored for the following issues for this low-risk pregnancy and has Encounter for supervision of normal pregnancy, unspecified, unspecified trimester; Late prenatal care; Chronic bilateral low back pain without sciatica; Mild intermittent asthma without complication; and Low vitamin D level on her problem list.  Patient reports no complaints.  Contractions: Not present. Vag. Bleeding: None.  Movement: Present. Denies leaking of fluid.   The following portions of the patient's history were reviewed and updated as appropriate: allergies, current medications, past family history, past medical history, past social history, past surgical history and problem list. Problem list updated.  Objective:   Vitals:   03/23/17 0843  BP: 119/78  Pulse: 83  Weight: 137 lb 3.2 oz (62.2 kg)    Fetal Status: Fetal Heart Rate (bpm): 139; doppler Fundal Height: 27 cm Movement: Present     General:  Alert, oriented and cooperative. Patient is in no acute distress.  Skin: Skin is warm and dry. No rash noted.   Cardiovascular: Normal heart rate noted  Respiratory: Normal respiratory effort, no problems with respiration noted  Abdomen: Soft, gravid, appropriate for gestational age.  Pain/Pressure: Absent     Pelvic: Cervical exam deferred        Extremities: Normal range of motion.  Edema: None  Mental Status:  Normal mood and affect. Normal behavior. Normal judgment and thought content.   Assessment and Plan:  Pregnancy: G1P0 at 8279w0d  1. Supervision of normal first pregnancy, antepartum      Doing well.  - Glucose Tolerance, 2 Hours w/1 Hour - CBC - HIV antibody - RPR  2. Low vitamin D level     Taking weekly vitamin D  Preterm labor symptoms and general obstetric precautions including but not limited to vaginal bleeding, contractions, leaking of fluid  and fetal movement were reviewed in detail with the patient. Please refer to After Visit Summary for other counseling recommendations.  Return in about 2 weeks (around 04/06/2017) for ROB.   Roe Coombsachelle A Gabrielle Wakeland, CNM

## 2017-03-23 NOTE — Progress Notes (Signed)
Patient has no concerns today. 

## 2017-03-24 LAB — CBC
Hematocrit: 36.6 % (ref 34.0–46.6)
Hemoglobin: 12.4 g/dL (ref 11.1–15.9)
MCH: 33.1 pg — ABNORMAL HIGH (ref 26.6–33.0)
MCHC: 33.9 g/dL (ref 31.5–35.7)
MCV: 98 fL — ABNORMAL HIGH (ref 79–97)
Platelets: 288 10*3/uL (ref 150–379)
RBC: 3.75 x10E6/uL — ABNORMAL LOW (ref 3.77–5.28)
RDW: 12.9 % (ref 12.3–15.4)
WBC: 6.6 10*3/uL (ref 3.4–10.8)

## 2017-03-24 LAB — RPR: RPR Ser Ql: NONREACTIVE

## 2017-03-24 LAB — HIV ANTIBODY (ROUTINE TESTING W REFLEX): HIV Screen 4th Generation wRfx: NONREACTIVE

## 2017-03-24 LAB — GLUCOSE TOLERANCE, 2 HOURS W/ 1HR
Glucose, 1 hour: 89 mg/dL (ref 65–179)
Glucose, 2 hour: 73 mg/dL (ref 65–152)
Glucose, Fasting: 70 mg/dL (ref 65–91)

## 2017-03-25 ENCOUNTER — Ambulatory Visit (HOSPITAL_COMMUNITY)
Admission: RE | Admit: 2017-03-25 | Discharge: 2017-03-25 | Disposition: A | Discharge status: Home / Self Care | Payer: Medicaid Other | Source: Ambulatory Visit | Attending: Certified Nurse Midwife | Admitting: Certified Nurse Midwife

## 2017-03-25 ENCOUNTER — Other Ambulatory Visit: Payer: Self-pay | Admitting: Certified Nurse Midwife

## 2017-03-25 DIAGNOSIS — Z3686 Encounter for antenatal screening for cervical length: Secondary | ICD-10-CM

## 2017-03-25 DIAGNOSIS — Z362 Encounter for other antenatal screening follow-up: Secondary | ICD-10-CM

## 2017-03-25 DIAGNOSIS — O0932 Supervision of pregnancy with insufficient antenatal care, second trimester: Secondary | ICD-10-CM

## 2017-03-25 DIAGNOSIS — J45909 Unspecified asthma, uncomplicated: Secondary | ICD-10-CM

## 2017-03-25 DIAGNOSIS — O9989 Other specified diseases and conditions complicating pregnancy, childbirth and the puerperium: Secondary | ICD-10-CM | POA: Diagnosis not present

## 2017-03-25 DIAGNOSIS — Z3A27 27 weeks gestation of pregnancy: Secondary | ICD-10-CM | POA: Insufficient documentation

## 2017-03-25 DIAGNOSIS — O99519 Diseases of the respiratory system complicating pregnancy, unspecified trimester: Secondary | ICD-10-CM

## 2017-03-25 DIAGNOSIS — Z3400 Encounter for supervision of normal first pregnancy, unspecified trimester: Secondary | ICD-10-CM | POA: Diagnosis not present

## 2017-03-30 ENCOUNTER — Other Ambulatory Visit: Payer: Self-pay | Admitting: Certified Nurse Midwife

## 2017-03-30 DIAGNOSIS — Z3400 Encounter for supervision of normal first pregnancy, unspecified trimester: Secondary | ICD-10-CM

## 2017-03-31 ENCOUNTER — Other Ambulatory Visit: Payer: Self-pay | Admitting: Certified Nurse Midwife

## 2017-03-31 DIAGNOSIS — Z3400 Encounter for supervision of normal first pregnancy, unspecified trimester: Secondary | ICD-10-CM

## 2017-04-06 ENCOUNTER — Ambulatory Visit (INDEPENDENT_AMBULATORY_CARE_PROVIDER_SITE_OTHER): Payer: Medicaid Other | Admitting: Certified Nurse Midwife

## 2017-04-06 ENCOUNTER — Encounter: Payer: Self-pay | Admitting: Certified Nurse Midwife

## 2017-04-06 VITALS — BP 120/74 | HR 87 | Wt 140.1 lb

## 2017-04-06 DIAGNOSIS — E559 Vitamin D deficiency, unspecified: Secondary | ICD-10-CM

## 2017-04-06 DIAGNOSIS — R7989 Other specified abnormal findings of blood chemistry: Secondary | ICD-10-CM

## 2017-04-06 DIAGNOSIS — Z3400 Encounter for supervision of normal first pregnancy, unspecified trimester: Secondary | ICD-10-CM

## 2017-04-06 NOTE — Patient Instructions (Addendum)
Contraception Choices Contraception (birth control) is the use of any methods or devices to prevent pregnancy. Below are some methods to help avoid pregnancy. Hormonal methods  Contraceptive implant. This is a thin, plastic tube containing progesterone hormone. It does not contain estrogen hormone. Your health care provider inserts the tube in the inner part of the upper arm. The tube can remain in place for up to 3 years. After 3 years, the implant must be removed. The implant prevents the ovaries from releasing an egg (ovulation), thickens the cervical mucus to prevent sperm from entering the uterus, and thins the lining of the inside of the uterus.  Progesterone-only injections. These injections are given every 3 months by your health care provider to prevent pregnancy. This synthetic progesterone hormone stops the ovaries from releasing eggs. It also thickens cervical mucus and changes the uterine lining. This makes it harder for sperm to survive in the uterus.  Birth control pills. These pills contain estrogen and progesterone hormone. They work by preventing the ovaries from releasing eggs (ovulation). They also cause the cervical mucus to thicken, preventing the sperm from entering the uterus. Birth control pills are prescribed by a health care provider.Birth control pills can also be used to treat heavy periods.  Minipill. This type of birth control pill contains only the progesterone hormone. They are taken every day of each month and must be prescribed by your health care provider.  Birth control patch. The patch contains hormones similar to those in birth control pills. It must be changed once a week and is prescribed by a health care provider.  Vaginal ring. The ring contains hormones similar to those in birth control pills. It is left in the vagina for 3 weeks, removed for 1 week, and then a new one is put back in place. The patient must be comfortable inserting and removing the ring from  the vagina.A health care provider's prescription is necessary.  Emergency contraception. Emergency contraceptives prevent pregnancy after unprotected sexual intercourse. This pill can be taken right after sex or up to 5 days after unprotected sex. It is most effective the sooner you take the pills after having sexual intercourse. Most emergency contraceptive pills are available without a prescription. Check with your pharmacist. Do not use emergency contraception as your only form of birth control. Barrier methods  Female condom. This is a thin sheath (latex or rubber) that is worn over the penis during sexual intercourse. It can be used with spermicide to increase effectiveness.  Female condom. This is a soft, loose-fitting sheath that is put into the vagina before sexual intercourse.  Diaphragm. This is a soft, latex, dome-shaped barrier that must be fitted by a health care provider. It is inserted into the vagina, along with a spermicidal jelly. It is inserted before intercourse. The diaphragm should be left in the vagina for 6 to 8 hours after intercourse.  Cervical cap. This is a round, soft, latex or plastic cup that fits over the cervix and must be fitted by a health care provider. The cap can be left in place for up to 48 hours after intercourse.  Sponge. This is a soft, circular piece of polyurethane foam. The sponge has spermicide in it. It is inserted into the vagina after wetting it and before sexual intercourse.  Spermicides. These are chemicals that kill or block sperm from entering the cervix and uterus. They come in the form of creams, jellies, suppositories, foam, or tablets. They do not require a prescription. They   are inserted into the vagina with an applicator before having sexual intercourse. The process must be repeated every time you have sexual intercourse. Intrauterine contraception  Intrauterine device (IUD). This is a T-shaped device that is put in a woman's uterus during  a menstrual period to prevent pregnancy. There are 2 types: ? Copper IUD. This type of IUD is wrapped in copper wire and is placed inside the uterus. Copper makes the uterus and fallopian tubes produce a fluid that kills sperm. It can stay in place for 10 years. ? Hormone IUD. This type of IUD contains the hormone progestin (synthetic progesterone). The hormone thickens the cervical mucus and prevents sperm from entering the uterus, and it also thins the uterine lining to prevent implantation of a fertilized egg. The hormone can weaken or kill the sperm that get into the uterus. It can stay in place for 3-5 years, depending on which type of IUD is used. Permanent methods of contraception  Female tubal ligation. This is when the woman's fallopian tubes are surgically sealed, tied, or blocked to prevent the egg from traveling to the uterus.  Hysteroscopic sterilization. This involves placing a small coil or insert into each fallopian tube. Your doctor uses a technique called hysteroscopy to do the procedure. The device causes scar tissue to form. This results in permanent blockage of the fallopian tubes, so the sperm cannot fertilize the egg. It takes about 3 months after the procedure for the tubes to become blocked. You must use another form of birth control for these 3 months.  Female sterilization. This is when the female has the tubes that carry sperm tied off (vasectomy).This blocks sperm from entering the vagina during sexual intercourse. After the procedure, the man can still ejaculate fluid (semen). Natural planning methods  Natural family planning. This is not having sexual intercourse or using a barrier method (condom, diaphragm, cervical cap) on days the woman could become pregnant.  Calendar method. This is keeping track of the length of each menstrual cycle and identifying when you are fertile.  Ovulation method. This is avoiding sexual intercourse during ovulation.  Symptothermal method.  This is avoiding sexual intercourse during ovulation, using a thermometer and ovulation symptoms.  Post-ovulation method. This is timing sexual intercourse after you have ovulated. Regardless of which type or method of contraception you choose, it is important that you use condoms to protect against the transmission of sexually transmitted infections (STIs). Talk with your health care provider about which form of contraception is most appropriate for you. This information is not intended to replace advice given to you by your health care provider. Make sure you discuss any questions you have with your health care provider. Document Released: 08/03/2005 Document Revised: 01/09/2016 Document Reviewed: 01/26/2013 Elsevier Interactive Patient Education  2017 Elsevier Inc.   AREA PEDIATRIC/FAMILY PRACTICE PHYSICIANS  [facility] [address], [address] [place], [place]  [address] Phone - [phone]   Fax - [phone]  [facility] [address] [place], [place] [address] Phone - [phone]   Fax - [phone]  [facility] [address] [place], [place]  [address] Phone - [phone]   Fax - [phone]  [facility] [address] [place], [place]  [address] Phone - [phone]   Fax - [phone]  [facility] [address] [place], [place]  [address] Phone - [phone]   Fax - [phone]  [facility] [address]   [place], [place]  [address] Phone - [phone]   Fax - [phone]  [facility] [address] [place], [place]  [address] Phone - [phone]   Fax - [phone]  [facility] [address] [place], [place]  [address] Phone - [phone]   Fax - [phone]  [facility] [address] [place], [place]  [address] Phone - [phone]    Fax - [phone] [facility] [address] [place], [place]  [address] Phone - [phone]   Fax - [phone]  [facility] [address] [place], [place]  [address] Phone - [phone]   Fax - [phone]  [facility] [address] [place], [place]  [address] Phone - [phone]   Fax - [phone]  [facility] [address] [place], [place]  [address] Phone - [phone]   Fax - [phone]  [facility] [address] [place], [place]  [address] Phone - [phone]  [facility] [address] [place], [place]  [address] Phone - [phone]   Fax - [phone]  [facility] [address] [place], [place]  [address] Phone - [phone]   Fax - [phone]  [facility] [address] [place], [place]  [address] Phone - [phone]   Fax - [phone]  [facility] [address] [place], [place]  [address] Phone - [phone]   Fax - [phone]   [facility] [address] [place], [place]  [address] Phone - [phone]   Fax - [phone]  [facility] [address] [place], [place]  [address] Phone - [phone]   Fax - [phone]  [facility] [address] [place], [place]  [address] Phone - [phone]   Fax - [phone]  [facility] [address] , [place]  [address] Phone - [phone]   Fax - [phone]  [facility] [address] , [place]  [address] Phone - [phone]   Fax - [phone] [facility] [address] [place], [place]  [address] Phone - [phone]   Fax - [phone]  [facility] [address] [place], [place]  [address] Phone - [phone]   Fax -  [phone]  [facility] [address] [place], [place]  [address] Phone - [phone]   Fax - [phone]  [facility] Charlene Flemming MD [address] [place]  [address] Phone [phone]  Fax [phone]   

## 2017-04-06 NOTE — Progress Notes (Signed)
   PRENATAL VISIT NOTE  Subjective:  Kendra Francis is a 21 y.o. G1P0 at [redacted]w[redacted]d being seen today for ongoing prenatal care.  She is currently monitored for the following issues for this low-risk pregnancy and has Encounter for supervision of normal pregnancy, unspecified, unspecified trimester; Late prenatal care; Chronic bilateral low back pain without sciatica; Mild intermittent asthma without complication; and Low vitamin D level on her problem list.  Patient reports no complaints.  Contractions: Not present. Vag. Bleeding: None.  Movement: Present. Denies leaking of fluid.   The following portions of the patient's history were reviewed and updated as appropriate: allergies, current medications, past family history, past medical history, past social history, past surgical history and problem list. Problem list updated.  Objective:   Vitals:   04/06/17 0817  BP: 120/74  Pulse: 87  Weight: 140 lb 1.6 oz (63.5 kg)    Fetal Status: Fetal Heart Rate (bpm): 142; doppler Fundal Height: 27 cm Movement: Present     General:  Alert, oriented and cooperative. Patient is in no acute distress.  Skin: Skin is warm and dry. No rash noted.   Cardiovascular: Normal heart rate noted  Respiratory: Normal respiratory effort, no problems with respiration noted  Abdomen: Soft, gravid, appropriate for gestational age.  Pain/Pressure: Absent     Pelvic: Cervical exam deferred        Extremities: Normal range of motion.  Edema: None  Mental Status:  Normal mood and affect. Normal behavior. Normal judgment and thought content.   Assessment and Plan:  Pregnancy: G1P0 at [redacted]w[redacted]d  1. Supervision of normal first pregnancy, antepartum      Doing well.  F/U US on 03/25/17 EFW: 36%.  Weight gain this pregnancy 20 lbs.   2. Low vitamin D level     Taking vitamin D weekly.    Preterm labor symptoms and general obstetric precautions including but not limited to vaginal bleeding, contractions, leaking of fluid and  fetal movement were reviewed in detail with the patient. Please refer to After Visit Summary for other counseling recommendations.  Return in about 2 weeks (around 04/20/2017) for ROB.TDaP next ROB.    Roe Coombs, CNM

## 2017-04-21 ENCOUNTER — Ambulatory Visit (INDEPENDENT_AMBULATORY_CARE_PROVIDER_SITE_OTHER): Payer: Medicaid Other | Admitting: Certified Nurse Midwife

## 2017-04-21 VITALS — BP 110/74 | Wt 141.0 lb

## 2017-04-21 DIAGNOSIS — E559 Vitamin D deficiency, unspecified: Secondary | ICD-10-CM

## 2017-04-21 DIAGNOSIS — R7989 Other specified abnormal findings of blood chemistry: Secondary | ICD-10-CM

## 2017-04-21 DIAGNOSIS — Z3400 Encounter for supervision of normal first pregnancy, unspecified trimester: Secondary | ICD-10-CM

## 2017-04-21 NOTE — Patient Instructions (Addendum)
AREA PEDIATRIC/FAMILY PRACTICE PHYSICIANS  [facility] [address], [address] [place], [place]  [address] Phone - [phone]   Fax - [phone]  [facility] [address] [place], [place] [address] Phone - [phone]   Fax - [phone]  JACK AMOS [address] [place], [place]  [address] Phone - [phone]   Fax - [phone]  [facility] [address] [place], [place]  [address] Phone - [phone]   Fax - [phone]  [facility] [address] [place], [place]  [address] Phone - [phone]   Fax - [phone]  [facility] [address], [address] [place], [place]  [address] Phone - [phone]   Fax - [phone]  [facility] [address] [place], [place]  [address] Phone - [phone]   Fax - [phone]  [facility] [address], [address] [place], [place]  [address] Phone - [phone]   Fax - [phone]  [facility] [address] [place], [place]  [address] Phone - [phone]   Fax - [phone] [facility] [address] [place], [place]  [address] Phone - [phone]   Fax - [phone]  [facility] [address] [place], [place]  [address] Phone - [phone]   Fax - [phone]  [facility] [address] [place], [place]  [address] Phone - [phone]   Fax - [phone]  [facility] [address], [address] [place], [place]  [address] Phone - [phone]   Fax - [phone]  SHILPA GOSRANI [address] [place], [place]  [address] Phone - [phone]  [facility] [address] [place], [place]  [address] Phone - [phone]   Fax - [phone]  [facility] [address] [place], [place]  [address] Phone - [phone]   Fax - [phone]  [facility] [address] [place], [place]  [address] Phone - [phone]   Fax - [phone]  [facility] [address] [place], [place]  [address] Phone - [phone]   Fax - [phone]   [facility] [address] [place], [place]  [address] Phone - [phone]   Fax - [phone]  [facility] [address] [place], [place]  [address] Phone - [phone]   Fax - [phone]  DAVID RUBIN [address] [place], [place]  [address] Phone - [phone]   Fax - [phone]  [facility] [address] [place], [place]  [address] Phone - [phone]   Fax - [phone]  [facility] [address] [place], [place]  [address] Phone - [phone]   Fax - [phone] [facility] [address] [place], [place]  [address] Phone - [phone]   Fax - [phone]   [facility] [address] [place], [place]  [address] Phone - [phone]   Fax - [phone]   [facility] [place] [address] [place],   [address] Phone - [phone]   Fax - [phone]  [facility] [place] Charlene Flemming MD [address] [place]  [address] Phone [phone]  Fax [phone]  Contraception Choices Contraception (birth control) is the use of any methods or devices to prevent   pregnancy. Below are some methods to help avoid pregnancy. Hormonal methods  Contraceptive implant. This is a thin, plastic tube containing progesterone hormone. It does not contain estrogen hormone. Your health care provider inserts the tube in the inner part of the upper arm. The tube can remain in place for up to 3 years. After 3 years, the implant must be removed. The implant prevents the  ovaries from releasing an egg (ovulation), thickens the cervical mucus to prevent sperm from entering the uterus, and thins the lining of the inside of the uterus.  Progesterone-only injections. These injections are given every 3 months by your health care provider to prevent pregnancy. This synthetic progesterone hormone stops the ovaries from releasing eggs. It also thickens cervical mucus and changes the uterine lining. This makes it harder for sperm to survive in the uterus.  Birth control pills. These pills contain estrogen and progesterone hormone. They work by preventing the ovaries from releasing eggs (ovulation). They also cause the cervical mucus to thicken, preventing the sperm from entering the uterus. Birth control pills are prescribed by a health care provider.Birth control pills can also be used to treat heavy periods.  Minipill. This type of birth control pill contains only the progesterone hormone. They are taken every day of each month and must be prescribed by your health care provider.  Birth control patch. The patch contains hormones similar to those in birth control pills. It must be changed once a week and is prescribed by a health care provider.  Vaginal ring. The ring contains hormones similar to those in birth control pills. It is left in the vagina for 3 weeks, removed for 1 week, and then a new one is put back in place. The patient must be comfortable inserting and removing the ring from the vagina.A health care provider's prescription is necessary.  Emergency contraception. Emergency contraceptives prevent pregnancy after unprotected sexual intercourse. This pill can be taken right after sex or up to 5 days after unprotected sex. It is most effective the sooner you take the pills after having sexual intercourse. Most emergency contraceptive pills are available without a prescription. Check with your pharmacist. Do not use emergency contraception as your only form of birth  control. Barrier methods  Female condom. This is a thin sheath (latex or rubber) that is worn over the penis during sexual intercourse. It can be used with spermicide to increase effectiveness.  Female condom. This is a soft, loose-fitting sheath that is put into the vagina before sexual intercourse.  Diaphragm. This is a soft, latex, dome-shaped barrier that must be fitted by a health care provider. It is inserted into the vagina, along with a spermicidal jelly. It is inserted before intercourse. The diaphragm should be left in the vagina for 6 to 8 hours after intercourse.  Cervical cap. This is a round, soft, latex or plastic cup that fits over the cervix and must be fitted by a health care provider. The cap can be left in place for up to 48 hours after intercourse.  Sponge. This is a soft, circular piece of polyurethane foam. The sponge has spermicide in it. It is inserted into the vagina after wetting it and before sexual intercourse.  Spermicides. These are chemicals that kill or block sperm from entering the cervix and uterus. They come in the form of creams, jellies, suppositories, foam, or tablets. They do not require a prescription. They are inserted into the vagina with an applicator before having sexual intercourse. The process   must be repeated every time you have sexual intercourse. Intrauterine contraception  Intrauterine device (IUD). This is a T-shaped device that is put in a woman's uterus during a menstrual period to prevent pregnancy. There are 2 types: ? Copper IUD. This type of IUD is wrapped in copper wire and is placed inside the uterus. Copper makes the uterus and fallopian tubes produce a fluid that kills sperm. It can stay in place for 10 years. ? Hormone IUD. This type of IUD contains the hormone progestin (synthetic progesterone). The hormone thickens the cervical mucus and prevents sperm from entering the uterus, and it also thins the uterine lining to prevent  implantation of a fertilized egg. The hormone can weaken or kill the sperm that get into the uterus. It can stay in place for 3-5 years, depending on which type of IUD is used. Permanent methods of contraception  Female tubal ligation. This is when the woman's fallopian tubes are surgically sealed, tied, or blocked to prevent the egg from traveling to the uterus.  Hysteroscopic sterilization. This involves placing a small coil or insert into each fallopian tube. Your doctor uses a technique called hysteroscopy to do the procedure. The device causes scar tissue to form. This results in permanent blockage of the fallopian tubes, so the sperm cannot fertilize the egg. It takes about 3 months after the procedure for the tubes to become blocked. You must use another form of birth control for these 3 months.  Female sterilization. This is when the female has the tubes that carry sperm tied off (vasectomy).This blocks sperm from entering the vagina during sexual intercourse. After the procedure, the man can still ejaculate fluid (semen). Natural planning methods  Natural family planning. This is not having sexual intercourse or using a barrier method (condom, diaphragm, cervical cap) on days the woman could become pregnant.  Calendar method. This is keeping track of the length of each menstrual cycle and identifying when you are fertile.  Ovulation method. This is avoiding sexual intercourse during ovulation.  Symptothermal method. This is avoiding sexual intercourse during ovulation, using a thermometer and ovulation symptoms.  Post-ovulation method. This is timing sexual intercourse after you have ovulated. Regardless of which type or method of contraception you choose, it is important that you use condoms to protect against the transmission of sexually transmitted infections (STIs). Talk with your health care provider about which form of contraception is most appropriate for you. This information is not  intended to replace advice given to you by your health care provider. Make sure you discuss any questions you have with your health care provider. Document Released: 08/03/2005 Document Revised: 01/09/2016 Document Reviewed: 01/26/2013 Elsevier Interactive Patient Education  2017 Elsevier Inc.  

## 2017-04-22 NOTE — Progress Notes (Signed)
   PRENATAL VISIT NOTE  Subjective:  Kendra Francis is a 21 y.o. G1P0 at 2838w2d being seen today for ongoing prenatal care.  She is currently monitored for the following issues for this low-risk pregnancy and has Encounter for supervision of normal pregnancy, unspecified, unspecified trimester; Late prenatal care; Chronic bilateral low back pain without sciatica; Mild intermittent asthma without complication; and Low vitamin D level on her problem list.  Patient reports no complaints.  Contractions: Not present. Vag. Bleeding: None.  Movement: Present. Denies leaking of fluid.   The following portions of the patient's history were reviewed and updated as appropriate: allergies, current medications, past family history, past medical history, past social history, past surgical history and problem list. Problem list updated.  Objective:   Vitals:   04/21/17 0827  BP: 110/74  Weight: 141 lb (64 kg)    Fetal Status: Fetal Heart Rate (bpm): 144; doppler Fundal Height: 29 cm Movement: Present     General:  Alert, oriented and cooperative. Patient is in no acute distress.  Skin: Skin is warm and dry. No rash noted.   Cardiovascular: Normal heart rate noted  Respiratory: Normal respiratory effort, no problems with respiration noted  Abdomen: Soft, gravid, appropriate for gestational age.  Pain/Pressure: Absent     Pelvic: Cervical exam deferred        Extremities: Normal range of motion.  Edema: None  Mental Status:  Normal mood and affect. Normal behavior. Normal judgment and thought content.   Assessment and Plan:  Pregnancy: G1P0 at 2538w2d  1. Supervision of normal first pregnancy, antepartum     Doing well  2. Low vitamin D level     Taking weekly vitamin D.   Preterm labor symptoms and general obstetric precautions including but not limited to vaginal bleeding, contractions, leaking of fluid and fetal movement were reviewed in detail with the patient. Please refer to After Visit  Summary for other counseling recommendations.  Return in about 2 weeks (around 05/05/2017) for ROB.   Roe Coombsachelle A Gabriela Giannelli, CNM

## 2017-05-05 ENCOUNTER — Encounter: Payer: Self-pay | Admitting: Certified Nurse Midwife

## 2017-05-05 ENCOUNTER — Ambulatory Visit (INDEPENDENT_AMBULATORY_CARE_PROVIDER_SITE_OTHER): Payer: Medicaid Other | Admitting: Certified Nurse Midwife

## 2017-05-05 VITALS — BP 118/77 | HR 91 | Wt 146.7 lb

## 2017-05-05 DIAGNOSIS — R7989 Other specified abnormal findings of blood chemistry: Secondary | ICD-10-CM

## 2017-05-05 DIAGNOSIS — Z3403 Encounter for supervision of normal first pregnancy, third trimester: Secondary | ICD-10-CM

## 2017-05-05 DIAGNOSIS — O0933 Supervision of pregnancy with insufficient antenatal care, third trimester: Secondary | ICD-10-CM

## 2017-05-05 DIAGNOSIS — Z3400 Encounter for supervision of normal first pregnancy, unspecified trimester: Secondary | ICD-10-CM

## 2017-05-05 DIAGNOSIS — O0930 Supervision of pregnancy with insufficient antenatal care, unspecified trimester: Secondary | ICD-10-CM

## 2017-05-05 NOTE — Patient Instructions (Addendum)
AREA PEDIATRIC/FAMILY PRACTICE PHYSICIANS  [facility] [address] [place], [place]  [address] Phone - [phone]   Fax - [phone]  [facility] [address] [place], [place] [address] Phone - [phone]   Fax - [phone]  JACK AMOS [address] [place], [place]  [address] Phone - [phone]   Fax - [phone]  [facility] [address] [place], [place]  [address] Phone - [phone]   Fax - [phone]  [facility] [address] [place], [place]  [address] Phone - [phone]   Fax - [phone]  [facility] [address] [place], [place]  [address] Phone - [phone]   Fax - [phone]  [facility] [address] [place], [place]  [address] Phone - [phone]   Fax - [phone]  [facility] [address] [place], [place]  [address] Phone - [phone]   Fax - [phone]  [facility] [address] [place], [place]  [address] Phone - [phone]   Fax - [phone] [facility] [address] [place], [place]  [address] Phone - [phone]   Fax - [phone]  [facility] [address] [place], [place]  [address] Phone - [phone]   Fax - [phone]  [facility] [address] [place], [place]  [address] Phone - [phone]   Fax - [phone]  [facility] [address] [place], [place]  [address] Phone - [phone]   Fax - [phone]  SHILPA GOSRANI [address] [place], [place]  [address] Phone - [phone]  [facility] [address] [place], [place]  [address] Phone - [phone]   Fax - [phone]  [facility] [address] [place], [place]  [address] Phone - [phone]   Fax - [phone]  [facility] [address] [place], [place]  [address] Phone - [phone]   Fax - [phone]  [facility] [address] [place], [place]  [address] Phone - [phone]   Fax - [phone]   [facility] [address] [place], [place]  [address] Phone - [phone]   Fax - [phone]  [facility] [address] [place], [place]  [address] Phone - [phone]   Fax - [phone]  DAVID RUBIN [address] [place], [place]  [address] Phone - [phone]   Fax - [phone]  [facility] [address] [place], [place]  [address] Phone - [phone]   Fax - [phone]  [facility] [address] , [place]  [address] Phone - [phone]   Fax - [phone] [facility] [address] [place], [place]  [address] Phone - [phone]   Fax - [phone]  [facility] [address] [place], [place]  [address] Phone - [phone]   Fax - [phone]  [facility] [facility] [address] [place], [place]  [address] Phone - [phone]   Fax - [phone]  [facility] Charlene Flemming MD [address] [place] [address] Phone [phone]  Fax [phone]  Contraception Choices Contraception (birth control) is the use of any methods or devices to prevent   pregnancy. Below are some methods to help avoid pregnancy. Hormonal methods  Contraceptive implant. This is a thin, plastic tube containing progesterone hormone. It does not contain estrogen hormone. Your health care provider inserts the tube in the inner part of the upper arm. The tube can remain in place for up to 3 years. After 3 years, the implant must be removed. The implant prevents the  ovaries from releasing an egg (ovulation), thickens the cervical mucus to prevent sperm from entering the uterus, and thins the lining of the inside of the uterus.  Progesterone-only injections. These injections are given every 3 months by your health care provider to prevent pregnancy. This synthetic progesterone hormone stops the ovaries from releasing eggs. It also thickens cervical mucus and changes the uterine lining. This makes it harder for sperm to survive in the uterus.  Birth control pills. These pills contain estrogen and progesterone hormone. They work by preventing the ovaries from releasing eggs (ovulation). They also cause the cervical mucus to thicken, preventing the sperm from entering the uterus. Birth control pills are prescribed by a health care provider.Birth control pills can also be used to treat heavy periods.  Minipill. This type of birth control pill contains only the progesterone hormone. They are taken every day of each month and must be prescribed by your health care provider.  Birth control patch. The patch contains hormones similar to those in birth control pills. It must be changed once a week and is prescribed by a health care provider.  Vaginal ring. The ring contains hormones similar to those in birth control pills. It is left in the vagina for 3 weeks, removed for 1 week, and then a new one is put back in place. The patient must be comfortable inserting and removing the ring from the vagina.A health care provider's prescription is necessary.  Emergency contraception. Emergency contraceptives prevent pregnancy after unprotected sexual intercourse. This pill can be taken right after sex or up to 5 days after unprotected sex. It is most effective the sooner you take the pills after having sexual intercourse. Most emergency contraceptive pills are available without a prescription. Check with your pharmacist. Do not use emergency contraception as your only form of birth  control. Barrier methods  Female condom. This is a thin sheath (latex or rubber) that is worn over the penis during sexual intercourse. It can be used with spermicide to increase effectiveness.  Female condom. This is a soft, loose-fitting sheath that is put into the vagina before sexual intercourse.  Diaphragm. This is a soft, latex, dome-shaped barrier that must be fitted by a health care provider. It is inserted into the vagina, along with a spermicidal jelly. It is inserted before intercourse. The diaphragm should be left in the vagina for 6 to 8 hours after intercourse.  Cervical cap. This is a round, soft, latex or plastic cup that fits over the cervix and must be fitted by a health care provider. The cap can be left in place for up to 48 hours after intercourse.  Sponge. This is a soft, circular piece of polyurethane foam. The sponge has spermicide in it. It is inserted into the vagina after wetting it and before sexual intercourse.  Spermicides. These are chemicals that kill or block sperm from entering the cervix and uterus. They come in the form of creams, jellies, suppositories, foam, or tablets. They do not require a prescription. They are inserted into the vagina with an applicator before having sexual intercourse. The process   must be repeated every time you have sexual intercourse. Intrauterine contraception  Intrauterine device (IUD). This is a T-shaped device that is put in a woman's uterus during a menstrual period to prevent pregnancy. There are 2 types: ? Copper IUD. This type of IUD is wrapped in copper wire and is placed inside the uterus. Copper makes the uterus and fallopian tubes produce a fluid that kills sperm. It can stay in place for 10 years. ? Hormone IUD. This type of IUD contains the hormone progestin (synthetic progesterone). The hormone thickens the cervical mucus and prevents sperm from entering the uterus, and it also thins the uterine lining to prevent  implantation of a fertilized egg. The hormone can weaken or kill the sperm that get into the uterus. It can stay in place for 3-5 years, depending on which type of IUD is used. Permanent methods of contraception  Female tubal ligation. This is when the woman's fallopian tubes are surgically sealed, tied, or blocked to prevent the egg from traveling to the uterus.  Hysteroscopic sterilization. This involves placing a small coil or insert into each fallopian tube. Your doctor uses a technique called hysteroscopy to do the procedure. The device causes scar tissue to form. This results in permanent blockage of the fallopian tubes, so the sperm cannot fertilize the egg. It takes about 3 months after the procedure for the tubes to become blocked. You must use another form of birth control for these 3 months.  Female sterilization. This is when the female has the tubes that carry sperm tied off (vasectomy).This blocks sperm from entering the vagina during sexual intercourse. After the procedure, the man can still ejaculate fluid (semen). Natural planning methods  Natural family planning. This is not having sexual intercourse or using a barrier method (condom, diaphragm, cervical cap) on days the woman could become pregnant.  Calendar method. This is keeping track of the length of each menstrual cycle and identifying when you are fertile.  Ovulation method. This is avoiding sexual intercourse during ovulation.  Symptothermal method. This is avoiding sexual intercourse during ovulation, using a thermometer and ovulation symptoms.  Post-ovulation method. This is timing sexual intercourse after you have ovulated. Regardless of which type or method of contraception you choose, it is important that you use condoms to protect against the transmission of sexually transmitted infections (STIs). Talk with your health care provider about which form of contraception is most appropriate for you. This information is not  intended to replace advice given to you by your health care provider. Make sure you discuss any questions you have with your health care provider. Document Released: 08/03/2005 Document Revised: 01/09/2016 Document Reviewed: 01/26/2013 Elsevier Interactive Patient Education  2017 Elsevier Inc.  

## 2017-05-05 NOTE — Progress Notes (Signed)
Declined the FLU vaccine.  TDAP given. Tolerated well.

## 2017-05-05 NOTE — Progress Notes (Signed)
   PRENATAL VISIT NOTE  Subjective:  Kendra Francis is a 20 y.o. G1P0 at [redacted]w[redacted]d being seen today for ongoing prenatal care.  She is currently monitored for the following issues for this low-risk pregnancy and has Encounter for supervision of normal pregnancy, unspecified, unspecified trimester; Late prenatal care; Chronic bilateral low back pain without sciatica; Mild intermittent asthma without complication; and Low vitamin D level on her problem list.  Patient reports no complaints.  Contractions: Not present. Vag. Bleeding: None.  Movement: Present. Denies leaking of fluid.   The following portions of the patient's history were reviewed and updated as appropriate: allergies, current medications, past family history, past medical history, past social history, past surgical history and problem list. Problem list updated.  Objective:   Vitals:   05/05/17 1553  BP: 118/77  Pulse: 91  Weight: 146 lb 11.2 oz (66.5 kg)    Fetal Status: Fetal Heart Rate (bpm): 130-14o's by doppler Fundal Height: 31 cm Movement: Present     General:  Alert, oriented and cooperative. Patient is in no acute distress.  Skin: Skin is warm and dry. No rash noted.   Cardiovascular: Normal heart rate noted  Respiratory: Normal respiratory effort, no problems with respiration noted  Abdomen: Soft, gravid, appropriate for gestational age.  Pain/Pressure: Absent     Pelvic: Cervical exam deferred        Extremities: Normal range of motion.  Edema: None  Mental Status:  Normal mood and affect. Normal behavior. Normal judgment and thought content.   Assessment and Plan:  Pregnancy: G1P0 at 102w1d  1. Supervision of normal first pregnancy, antepartum      Doing well   2. Low vitamin D level     Taking weekly vitamin D  3. Late prenatal care      weeks  Preterm labor symptoms and general obstetric precautions including but not limited to vaginal bleeding, contractions, leaking of fluid and fetal movement were  reviewed in detail with the patient. Please refer to After Visit Summary for other counseling recommendations.  Return in about 2 weeks (around 05/19/2017) for ROB, GBS.   Roe Coombs, CNM

## 2017-05-19 ENCOUNTER — Ambulatory Visit (INDEPENDENT_AMBULATORY_CARE_PROVIDER_SITE_OTHER): Payer: Medicaid Other | Admitting: Certified Nurse Midwife

## 2017-05-19 ENCOUNTER — Other Ambulatory Visit (HOSPITAL_COMMUNITY)
Admission: RE | Admit: 2017-05-19 | Discharge: 2017-05-19 | Disposition: A | Discharge status: Home / Self Care | Payer: Medicaid Other | Source: Ambulatory Visit | Attending: Certified Nurse Midwife | Admitting: Certified Nurse Midwife

## 2017-05-19 VITALS — BP 126/81 | HR 90 | Wt 152.7 lb

## 2017-05-19 DIAGNOSIS — Z3401 Encounter for supervision of normal first pregnancy, first trimester: Secondary | ICD-10-CM | POA: Diagnosis not present

## 2017-05-19 DIAGNOSIS — Z3403 Encounter for supervision of normal first pregnancy, third trimester: Secondary | ICD-10-CM

## 2017-05-19 DIAGNOSIS — R7989 Other specified abnormal findings of blood chemistry: Secondary | ICD-10-CM

## 2017-05-19 DIAGNOSIS — Z3400 Encounter for supervision of normal first pregnancy, unspecified trimester: Secondary | ICD-10-CM

## 2017-05-19 NOTE — Progress Notes (Signed)
ROB GBS 

## 2017-05-19 NOTE — Progress Notes (Signed)
   PRENATAL VISIT NOTE  Subjective:  Kendra Francis is a 21 y.o. G1P0 at [redacted]w[redacted]d being seen today for ongoing prenatal care.  She is currently monitored for the following issues for this low-risk pregnancy and has Encounter for supervision of normal pregnancy, unspecified, unspecified trimester; Late prenatal care; Chronic bilateral low back pain without sciatica; Mild intermittent asthma without complication; and Low vitamin D level on her problem list.  Patient reports no complaints.  Contractions: Irritability. Vag. Bleeding: None.  Movement: Present. Denies leaking of fluid.   The following portions of the patient's history were reviewed and updated as appropriate: allergies, current medications, past family history, past medical history, past social history, past surgical history and problem list. Problem list updated.  Objective:   Vitals:   05/19/17 1515  BP: 126/81  Pulse: 90  Weight: 152 lb 11.2 oz (69.3 kg)    Fetal Status: Fetal Heart Rate (bpm): 133; doppler Fundal Height: 34 cm Movement: Present  Presentation: Vertex  General:  Alert, oriented and cooperative. Patient is in no acute distress.  Skin: Skin is warm and dry. No rash noted.   Cardiovascular: Normal heart rate noted  Respiratory: Normal respiratory effort, no problems with respiration noted  Abdomen: Soft, gravid, appropriate for gestational age.  Pain/Pressure: Absent     Pelvic: Cervical exam performed Dilation: Closed Effacement (%): 30 Station: -3  Extremities: Normal range of motion.  Edema: Trace  Mental Status:  Normal mood and affect. Normal behavior. Normal judgment and thought content.   Assessment and Plan:  Pregnancy: G1P0 at [redacted]w[redacted]d  1. Low vitamin D level     Taking weekly vitamin D.   2. Supervision of normal first pregnancy, antepartum      Doing well.  Planning to stop working on Friday.   - Strep Gp B NAA - Cervicovaginal ancillary only  Preterm labor symptoms and general obstetric  precautions including but not limited to vaginal bleeding, contractions, leaking of fluid and fetal movement were reviewed in detail with the patient. Please refer to After Visit Summary for other counseling recommendations.  Return in about 1 week (around 05/26/2017) for ROB.   Roe Coombs, CNM

## 2017-05-20 LAB — CERVICOVAGINAL ANCILLARY ONLY
Bacterial vaginitis: NEGATIVE
Candida vaginitis: NEGATIVE
Chlamydia: NEGATIVE
Neisseria Gonorrhea: NEGATIVE
Trichomonas: NEGATIVE

## 2017-05-21 LAB — STREP GP B NAA: Strep Gp B NAA: NEGATIVE

## 2017-05-24 ENCOUNTER — Other Ambulatory Visit: Payer: Self-pay | Admitting: Certified Nurse Midwife

## 2017-05-24 DIAGNOSIS — Z3400 Encounter for supervision of normal first pregnancy, unspecified trimester: Secondary | ICD-10-CM

## 2017-05-26 ENCOUNTER — Ambulatory Visit (INDEPENDENT_AMBULATORY_CARE_PROVIDER_SITE_OTHER): Payer: Medicaid Other | Admitting: Certified Nurse Midwife

## 2017-05-26 VITALS — BP 126/78 | HR 87 | Wt 152.0 lb

## 2017-05-26 DIAGNOSIS — Z3403 Encounter for supervision of normal first pregnancy, third trimester: Secondary | ICD-10-CM

## 2017-05-26 DIAGNOSIS — Z3400 Encounter for supervision of normal first pregnancy, unspecified trimester: Secondary | ICD-10-CM

## 2017-05-26 DIAGNOSIS — O0933 Supervision of pregnancy with insufficient antenatal care, third trimester: Secondary | ICD-10-CM

## 2017-05-26 DIAGNOSIS — R7989 Other specified abnormal findings of blood chemistry: Secondary | ICD-10-CM

## 2017-05-26 DIAGNOSIS — O0930 Supervision of pregnancy with insufficient antenatal care, unspecified trimester: Secondary | ICD-10-CM

## 2017-05-26 NOTE — Progress Notes (Signed)
   PRENATAL VISIT NOTE  Subjective:  Kendra Francis is a 21 y.o. G1P0 at [redacted]w[redacted]d being seen today for ongoing prenatal care.  She is currently monitored for the following issues for this low-risk pregnancy and has Encounter for supervision of normal pregnancy, unspecified, unspecified trimester; Late prenatal care; Chronic bilateral low back pain without sciatica; Mild intermittent asthma without complication; and Low vitamin D level on her problem list.  Patient reports no complaints.  Contractions: Irregular. Vag. Bleeding: None.  Movement: Present. Denies leaking of fluid.   The following portions of the patient's history were reviewed and updated as appropriate: allergies, current medications, past family history, past medical history, past social history, past surgical history and problem list. Problem list updated.  Objective:   Vitals:   05/26/17 1342  BP: 126/78  Pulse: 87  Weight: 152 lb (68.9 kg)    Fetal Status: Fetal Heart Rate (bpm): 147; doppler Fundal Height: 35 cm Movement: Present     General:  Alert, oriented and cooperative. Patient is in no acute distress.  Skin: Skin is warm and dry. No rash noted.   Cardiovascular: Normal heart rate noted  Respiratory: Normal respiratory effort, no problems with respiration noted  Abdomen: Soft, gravid, appropriate for gestational age.  Pain/Pressure: Absent     Pelvic: Cervical exam deferred        Extremities: Normal range of motion.     Mental Status:  Normal mood and affect. Normal behavior. Normal judgment and thought content.   Assessment and Plan:  Pregnancy: G1P0 at [redacted]w[redacted]d  1. Supervision of normal first pregnancy, antepartum     Doing well  2. Late prenatal care        3. Low vitamin D level     Taking weekly vitamin D  Preterm labor symptoms and general obstetric precautions including but not limited to vaginal bleeding, contractions, leaking of fluid and fetal movement were reviewed in detail with the  patient. Please refer to After Visit Summary for other counseling recommendations.  Return in about 1 week (around 06/02/2017) for ROB.   Roe Coombs, CNM

## 2017-06-02 ENCOUNTER — Other Ambulatory Visit: Payer: Self-pay | Admitting: Certified Nurse Midwife

## 2017-06-02 ENCOUNTER — Ambulatory Visit (HOSPITAL_COMMUNITY)
Admission: RE | Admit: 2017-06-02 | Discharge: 2017-06-02 | Disposition: A | Discharge status: Home / Self Care | Payer: Medicaid Other | Source: Ambulatory Visit | Attending: Certified Nurse Midwife | Admitting: Certified Nurse Midwife

## 2017-06-02 ENCOUNTER — Encounter: Payer: Self-pay | Admitting: Certified Nurse Midwife

## 2017-06-02 ENCOUNTER — Ambulatory Visit (INDEPENDENT_AMBULATORY_CARE_PROVIDER_SITE_OTHER): Payer: Medicaid Other | Admitting: Certified Nurse Midwife

## 2017-06-02 VITALS — BP 120/76 | HR 77 | Wt 150.5 lb

## 2017-06-02 DIAGNOSIS — Z3A37 37 weeks gestation of pregnancy: Secondary | ICD-10-CM

## 2017-06-02 DIAGNOSIS — O26843 Uterine size-date discrepancy, third trimester: Secondary | ICD-10-CM

## 2017-06-02 DIAGNOSIS — R7989 Other specified abnormal findings of blood chemistry: Secondary | ICD-10-CM

## 2017-06-02 DIAGNOSIS — O365990 Maternal care for other known or suspected poor fetal growth, unspecified trimester, not applicable or unspecified: Secondary | ICD-10-CM

## 2017-06-02 DIAGNOSIS — O365930 Maternal care for other known or suspected poor fetal growth, third trimester, not applicable or unspecified: Secondary | ICD-10-CM | POA: Insufficient documentation

## 2017-06-02 DIAGNOSIS — Z3400 Encounter for supervision of normal first pregnancy, unspecified trimester: Secondary | ICD-10-CM

## 2017-06-02 NOTE — Progress Notes (Signed)
Patient reports good fetal movement, denies pain. 

## 2017-06-02 NOTE — Progress Notes (Signed)
   PRENATAL VISIT NOTE  Subjective:  Kendra Francis is a 21 y.o. G1P0 at 7126w1d being seen today for ongoing prenatal care.  She is currently monitored for the following issues for this low-risk pregnancy and has Encounter for supervision of normal pregnancy, unspecified, unspecified trimester; Late prenatal care; Chronic bilateral low back pain without sciatica; Mild intermittent asthma without complication; and Low vitamin D level on her problem list.  Patient reports no complaints.  Contractions: Not present. Vag. Bleeding: None.  Movement: Present. Denies leaking of fluid.   The following portions of the patient's history were reviewed and updated as appropriate: allergies, current medications, past family history, past medical history, past social history, past surgical history and problem list. Problem list updated.  Objective:   Vitals:   06/02/17 0932  BP: 120/76  Pulse: 77  Weight: 150 lb 8 oz (68.3 kg)    Fetal Status: Fetal Heart Rate (bpm): 142; doppler Fundal Height: 33 cm Movement: Present     General:  Alert, oriented and cooperative. Patient is in no acute distress.  Skin: Skin is warm and dry. No rash noted.   Cardiovascular: Normal heart rate noted  Respiratory: Normal respiratory effort, no problems with respiration noted  Abdomen: Soft, gravid, appropriate for gestational age.  Pain/Pressure: Absent     Pelvic: Cervical exam deferred        Extremities: Normal range of motion.  Edema: None  Mental Status:  Normal mood and affect. Normal behavior. Normal judgment and thought content.   Assessment and Plan:  Pregnancy: G1P0 at 7126w1d  1. Supervision of normal first pregnancy, antepartum     Doing well.   2. Low vitamin D level     Taking weekly vitamin D.   3. Uterine size date discrepancy pregnancy, third trimester      S<D - US MFM OB FOLLOW UP; Future  Term labor symptoms and general obstetric precautions including but not limited to vaginal bleeding,  contractions, leaking of fluid and fetal movement were reviewed in detail with the patient. Please refer to After Visit Summary for other counseling recommendations.  Return in about 1 week (around 06/09/2017) for ROB.   Roe Coombsachelle A Heleena Miceli, CNM

## 2017-06-03 ENCOUNTER — Other Ambulatory Visit: Payer: Self-pay | Admitting: Certified Nurse Midwife

## 2017-06-03 ENCOUNTER — Other Ambulatory Visit (HOSPITAL_COMMUNITY): Payer: Self-pay | Admitting: *Deleted

## 2017-06-03 DIAGNOSIS — IMO0002 Reserved for concepts with insufficient information to code with codable children: Secondary | ICD-10-CM

## 2017-06-09 ENCOUNTER — Ambulatory Visit (HOSPITAL_COMMUNITY)
Admission: RE | Admit: 2017-06-09 | Discharge: 2017-06-09 | Disposition: A | Discharge status: Home / Self Care | Payer: Medicaid Other | Source: Ambulatory Visit | Attending: Certified Nurse Midwife | Admitting: Certified Nurse Midwife

## 2017-06-09 ENCOUNTER — Encounter (HOSPITAL_COMMUNITY): Payer: Self-pay

## 2017-06-09 ENCOUNTER — Ambulatory Visit (INDEPENDENT_AMBULATORY_CARE_PROVIDER_SITE_OTHER): Payer: Medicaid Other | Admitting: Certified Nurse Midwife

## 2017-06-09 ENCOUNTER — Encounter: Payer: Self-pay | Admitting: Certified Nurse Midwife

## 2017-06-09 ENCOUNTER — Other Ambulatory Visit (HOSPITAL_COMMUNITY): Payer: Self-pay | Admitting: Maternal and Fetal Medicine

## 2017-06-09 VITALS — BP 120/78 | HR 75 | Wt 153.0 lb

## 2017-06-09 DIAGNOSIS — Z3A38 38 weeks gestation of pregnancy: Secondary | ICD-10-CM

## 2017-06-09 DIAGNOSIS — IMO0002 Reserved for concepts with insufficient information to code with codable children: Secondary | ICD-10-CM

## 2017-06-09 DIAGNOSIS — R7989 Other specified abnormal findings of blood chemistry: Secondary | ICD-10-CM

## 2017-06-09 DIAGNOSIS — O365930 Maternal care for other known or suspected poor fetal growth, third trimester, not applicable or unspecified: Secondary | ICD-10-CM | POA: Insufficient documentation

## 2017-06-09 DIAGNOSIS — O0933 Supervision of pregnancy with insufficient antenatal care, third trimester: Secondary | ICD-10-CM | POA: Diagnosis not present

## 2017-06-09 DIAGNOSIS — Z3400 Encounter for supervision of normal first pregnancy, unspecified trimester: Secondary | ICD-10-CM

## 2017-06-09 DIAGNOSIS — O26843 Uterine size-date discrepancy, third trimester: Secondary | ICD-10-CM | POA: Diagnosis not present

## 2017-06-09 DIAGNOSIS — Z3403 Encounter for supervision of normal first pregnancy, third trimester: Secondary | ICD-10-CM

## 2017-06-09 HISTORY — DX: Unspecified asthma, uncomplicated: J45.909

## 2017-06-09 NOTE — Progress Notes (Signed)
Patient reports good fetal movement, denies pain. 

## 2017-06-09 NOTE — Progress Notes (Signed)
   PRENATAL VISIT NOTE  Subjective:  Kendra Francis is a 21 y.o. G1P0 at 8661w1d being seen today for ongoing prenatal care.  She is currently monitored for the following issues for this low-risk pregnancy and has Encounter for supervision of normal pregnancy, unspecified, unspecified trimester; Late prenatal care; Chronic bilateral low back pain without sciatica; Mild intermittent asthma without complication; and Low vitamin D level on her problem list.  Patient reports no complaints.  Contractions: Not present. Vag. Bleeding: None.  Movement: Present. Denies leaking of fluid.   The following portions of the patient's history were reviewed and updated as appropriate: allergies, current medications, past family history, past medical history, past social history, past surgical history and problem list. Problem list updated.  Objective:   Vitals:   06/09/17 0903  BP: 120/78  Pulse: 75  Weight: 153 lb (69.4 kg)    Fetal Status: Fetal Heart Rate (bpm): 130; doppler Fundal Height: 32 cm Movement: Present  Presentation: Vertex  General:  Alert, oriented and cooperative. Patient is in no acute distress.  Skin: Skin is warm and dry. No rash noted.   Cardiovascular: Normal heart rate noted  Respiratory: Normal respiratory effort, no problems with respiration noted  Abdomen: Soft, gravid, appropriate for gestational age.  Pain/Pressure: Absent     Pelvic: Cervical exam performed Dilation: 1 Effacement (%): 0 Station: -2  Extremities: Normal range of motion.  Edema: None  Mental Status:  Normal mood and affect. Normal behavior. Normal judgment and thought content.   Assessment and Plan:  Pregnancy: G1P0 at 7961w1d  1. Supervision of normal first pregnancy, antepartum     Doing well. Has US scheduled today for IUGR.  IOL for 39 weeks scheduled.   2. Low vitamin D level     Taking weekly vitamin D.   Preterm labor symptoms and general obstetric precautions including but not limited to vaginal  bleeding, contractions, leaking of fluid and fetal movement were reviewed in detail with the patient. Please refer to After Visit Summary for other counseling recommendations.  Return in about 4 weeks (around 07/07/2017) for Postpartum.   Roe Coombsachelle A Britania Shreeve, CNM

## 2017-06-11 ENCOUNTER — Inpatient Hospital Stay (HOSPITAL_COMMUNITY): Payer: Medicaid Other | Admitting: Anesthesiology

## 2017-06-11 ENCOUNTER — Encounter (HOSPITAL_COMMUNITY): Payer: Self-pay | Admitting: *Deleted

## 2017-06-11 ENCOUNTER — Inpatient Hospital Stay (HOSPITAL_COMMUNITY)
Admission: AD | Admit: 2017-06-11 | Discharge: 2017-06-13 | DRG: 807 | Disposition: A | Discharge status: Home / Self Care | Payer: Medicaid Other | Source: Ambulatory Visit | Attending: Obstetrics and Gynecology | Admitting: Obstetrics and Gynecology

## 2017-06-11 DIAGNOSIS — Z3400 Encounter for supervision of normal first pregnancy, unspecified trimester: Secondary | ICD-10-CM

## 2017-06-11 DIAGNOSIS — O4202 Full-term premature rupture of membranes, onset of labor within 24 hours of rupture: Secondary | ICD-10-CM | POA: Diagnosis not present

## 2017-06-11 DIAGNOSIS — O365930 Maternal care for other known or suspected poor fetal growth, third trimester, not applicable or unspecified: Secondary | ICD-10-CM | POA: Diagnosis not present

## 2017-06-11 DIAGNOSIS — Z3A38 38 weeks gestation of pregnancy: Secondary | ICD-10-CM

## 2017-06-11 DIAGNOSIS — J4520 Mild intermittent asthma, uncomplicated: Secondary | ICD-10-CM | POA: Diagnosis not present

## 2017-06-11 DIAGNOSIS — O9952 Diseases of the respiratory system complicating childbirth: Secondary | ICD-10-CM | POA: Diagnosis not present

## 2017-06-11 LAB — CBC
HCT: 37.4 % (ref 36.0–46.0)
Hemoglobin: 12.6 g/dL (ref 12.0–15.0)
MCH: 32.3 pg (ref 26.0–34.0)
MCHC: 33.7 g/dL (ref 30.0–36.0)
MCV: 95.9 fL (ref 78.0–100.0)
Platelets: 269 10*3/uL (ref 150–400)
RBC: 3.9 MIL/uL (ref 3.87–5.11)
RDW: 13.6 % (ref 11.5–15.5)
WBC: 10.1 10*3/uL (ref 4.0–10.5)

## 2017-06-11 LAB — ABO/RH: ABO/RH(D): O POS

## 2017-06-11 LAB — TYPE AND SCREEN
ABO/RH(D): O POS
Antibody Screen: NEGATIVE

## 2017-06-11 LAB — POCT FERN TEST: POCT Fern Test: POSITIVE

## 2017-06-11 MED ORDER — TERBUTALINE SULFATE 1 MG/ML IJ SOLN: 0.2500 mg | Freq: Once | INTRAMUSCULAR | Status: DC | PRN

## 2017-06-11 MED ORDER — ONDANSETRON HCL 4 MG/2ML IJ SOLN: 4.0000 mg | Freq: Four times a day (QID) | INTRAMUSCULAR | Status: DC | PRN

## 2017-06-11 MED ORDER — FENTANYL 2.5 MCG/ML BUPIVACAINE 1/10 % EPIDURAL INFUSION (WH - ANES)
14.0000 mL/h | INTRAMUSCULAR | Status: DC | PRN
  Administered 2017-06-11: 14 mL/h via EPIDURAL
  Filled 2017-06-11: qty 100

## 2017-06-11 MED ORDER — LACTATED RINGERS IV SOLN
500.0000 mL | Freq: Once | INTRAVENOUS | Status: AC
  Administered 2017-06-11: 500 mL via INTRAVENOUS

## 2017-06-11 MED ORDER — EPHEDRINE 5 MG/ML INJ: 10.0000 mg | INTRAVENOUS | Status: DC | PRN

## 2017-06-11 MED ORDER — DIPHENHYDRAMINE HCL 50 MG/ML IJ SOLN: 12.5000 mg | INTRAMUSCULAR | Status: DC | PRN

## 2017-06-11 MED ORDER — LACTATED RINGERS IV SOLN
INTRAVENOUS | Status: DC
  Administered 2017-06-11 (×2): via INTRAVENOUS

## 2017-06-11 MED ORDER — ACETAMINOPHEN 325 MG PO TABS
650.0000 mg | ORAL_TABLET | ORAL | Status: DC | PRN
  Administered 2017-06-11: 650 mg via ORAL

## 2017-06-11 MED ORDER — SOD CITRATE-CITRIC ACID 500-334 MG/5ML PO SOLN: 30.0000 mL | ORAL | Status: DC | PRN

## 2017-06-11 MED ORDER — OXYCODONE-ACETAMINOPHEN 5-325 MG PO TABS: 2.0000 | ORAL_TABLET | ORAL | Status: DC | PRN

## 2017-06-11 MED ORDER — LIDOCAINE HCL (PF) 1 % IJ SOLN
INTRAMUSCULAR | Status: DC | PRN
  Administered 2017-06-11: 7 mL via EPIDURAL
  Administered 2017-06-11: 6 mL via EPIDURAL

## 2017-06-11 MED ORDER — FENTANYL CITRATE (PF) 100 MCG/2ML IJ SOLN
100.0000 ug | INTRAMUSCULAR | Status: DC | PRN
  Filled 2017-06-11: qty 2

## 2017-06-11 MED ORDER — LACTATED RINGERS IV SOLN: 500.0000 mL | INTRAVENOUS | Status: DC | PRN

## 2017-06-11 MED ORDER — OXYTOCIN 40 UNITS IN LACTATED RINGERS INFUSION - SIMPLE MED: 2.5000 [IU]/h | INTRAVENOUS | Status: DC

## 2017-06-11 MED ORDER — OXYCODONE-ACETAMINOPHEN 5-325 MG PO TABS: 1.0000 | ORAL_TABLET | ORAL | Status: DC | PRN

## 2017-06-11 MED ORDER — IBUPROFEN 600 MG PO TABS
600.0000 mg | ORAL_TABLET | Freq: Four times a day (QID) | ORAL | Status: DC
  Administered 2017-06-11 – 2017-06-13 (×7): 600 mg via ORAL
  Filled 2017-06-11 (×7): qty 1

## 2017-06-11 MED ORDER — LIDOCAINE HCL (PF) 1 % IJ SOLN
30.0000 mL | INTRAMUSCULAR | Status: DC | PRN
  Filled 2017-06-11: qty 30

## 2017-06-11 MED ORDER — OXYTOCIN BOLUS FROM INFUSION
500.0000 mL | Freq: Once | INTRAVENOUS | Status: AC
  Administered 2017-06-11: 500 mL via INTRAVENOUS

## 2017-06-11 MED ORDER — ACETAMINOPHEN 325 MG PO TABS
650.0000 mg | ORAL_TABLET | ORAL | Status: DC | PRN
  Filled 2017-06-11: qty 2

## 2017-06-11 MED ORDER — MISOPROSTOL 50MCG HALF TABLET: 50.0000 ug | ORAL_TABLET | ORAL | Status: DC

## 2017-06-11 MED ORDER — PHENYLEPHRINE 40 MCG/ML (10ML) SYRINGE FOR IV PUSH (FOR BLOOD PRESSURE SUPPORT): 80.0000 ug | PREFILLED_SYRINGE | INTRAVENOUS | Status: DC | PRN

## 2017-06-11 MED ORDER — OXYTOCIN 40 UNITS IN LACTATED RINGERS INFUSION - SIMPLE MED
1.0000 m[IU]/min | INTRAVENOUS | Status: DC
  Administered 2017-06-11: 2 m[IU]/min via INTRAVENOUS
  Filled 2017-06-11: qty 1000

## 2017-06-11 MED ORDER — PHENYLEPHRINE 40 MCG/ML (10ML) SYRINGE FOR IV PUSH (FOR BLOOD PRESSURE SUPPORT)
80.0000 ug | PREFILLED_SYRINGE | INTRAVENOUS | Status: DC | PRN
  Filled 2017-06-11: qty 10

## 2017-06-11 NOTE — Anesthesia Preprocedure Evaluation (Signed)
Anesthesia Evaluation  Patient identified by MRN, date of birth, ID band Patient awake    Reviewed: Allergy & Precautions, H&P , NPO status , Patient's Chart, lab work & pertinent test results  Airway Mallampati: I  TM Distance: >3 FB Neck ROM: full    Dental no notable dental hx. (+) Teeth Intact   Pulmonary neg pulmonary ROS,    Pulmonary exam normal breath sounds clear to auscultation       Cardiovascular negative cardio ROS Normal cardiovascular exam Rhythm:regular Rate:Normal     Neuro/Psych negative neurological ROS  negative psych ROS   GI/Hepatic negative GI ROS, Neg liver ROS,   Endo/Other  negative endocrine ROS  Renal/GU negative Renal ROS  negative genitourinary   Musculoskeletal negative musculoskeletal ROS (+)   Abdominal Normal abdominal exam  (+)   Peds  Hematology negative hematology ROS (+)   Anesthesia Other Findings   Reproductive/Obstetrics (+) Pregnancy                             Anesthesia Physical Anesthesia Plan  ASA: II  Anesthesia Plan: Epidural   Post-op Pain Management:    Induction:   PONV Risk Score and Plan:   Airway Management Planned:   Additional Equipment:   Intra-op Plan:   Post-operative Plan:   Informed Consent: I have reviewed the patients History and Physical, chart, labs and discussed the procedure including the risks, benefits and alternatives for the proposed anesthesia with the patient or authorized representative who has indicated his/her understanding and acceptance.       Plan Discussed with:   Anesthesia Plan Comments:         Anesthesia Quick Evaluation  

## 2017-06-11 NOTE — Anesthesia Pain Management Evaluation Note (Signed)
  CRNA Pain Management Visit Note  Patient: Kendra DonovanAngel Francis, 21 y.o., female  "Hello I am a member of the anesthesia team at [facility]. We have an anesthesia team available at all times to provide care throughout the hospital, including epidural management and anesthesia for C-section. I don't know your plan for the delivery whether it a natural birth, water birth, IV sedation, nitrous supplementation, doula or epidural, but we want to meet your pain goals."   1.Was your pain managed to your expectations on prior hospitalizations?   No prior hospitalizations  2.What is your expectation for pain management during this hospitalization?     Nitrous Oxide  3.How can we help you reach that goal? Patient is going to try to go natural but may try nitrous if needed. She is aware she can change to IV pain med/and or epidural as desired.  Record the patient's initial score and the patient's pain goal.   Pain: 4  Pain Goal: 4 [facility] wants you to be able to say your pain was always managed very well.  Mitsuo Budnick 06/11/2017

## 2017-06-11 NOTE — H&P (Signed)
Kendra Francis is a 21 y.o. female G1P0 @[redacted]w[redacted]d  presenting for rupture of membranes.  She reports cramping/contractions starting last night then leakage of clear fluid starting this morning around 0840.  She reports good fetal movement, denies vaginal bleeding, vaginal itching/burning, urinary symptoms, h/a, dizziness, n/v, or fever/chills.    Her pregnancy is complicated by late to care at 22 weeks and IUGR <10%tile.  Otherwise her pregnancy has been uncomplicated.    OB History    Gravida Para Term Preterm AB Living   1             SAB TAB Ectopic Multiple Live Births                 Past Medical History:  Diagnosis Date  . Asthma    Past Surgical History:  Procedure Laterality Date  . NO PAST SURGERIES     Family History: family history is not on file. Social History:  reports that she has never smoked. She has never used smokeless tobacco. She reports that she does not drink alcohol or use drugs.     Maternal Diabetes: No Genetic Screening: Declined Maternal Ultrasounds/Referrals: Abnormal:  Findings:   IUGR Fetal Ultrasounds or other Referrals:  None Maternal Substance Abuse:  No Significant Maternal Medications:  None Significant Maternal Lab Results:  Lab values include: Group B Strep negative Other Comments:  None  Review of Systems  Constitutional: Negative for chills, fever and malaise/fatigue.  Eyes: Negative for blurred vision.  Respiratory: Negative for cough and shortness of breath.   Cardiovascular: Negative for chest pain.  Gastrointestinal: Positive for abdominal pain. Negative for heartburn, nausea and vomiting.  Genitourinary: Negative for dysuria, frequency and urgency.  Musculoskeletal: Negative.   Neurological: Negative for dizziness and headaches.  Psychiatric/Behavioral: Negative for depression.   Maternal Medical History:  Reason for admission: Rupture of membranes.  Nausea.  Contractions: Onset was 6-12 hours ago.   Frequency: regular.    Perceived severity is mild.    Fetal activity: Perceived fetal activity is normal.   Last perceived fetal movement was within the past hour.    Prenatal complications: IUGR.   Prenatal Complications - Diabetes: none.    Dilation: 3 Effacement (%): 80 Station: -1 Exam by:: F. Morris, RNC Blood pressure 124/82, pulse 81, temperature 98.4 F (36.9 C), temperature source Oral, resp. rate 16, weight 153 lb (69.4 kg), last menstrual period 09/03/2016, SpO2 100 %. Maternal Exam:  Uterine Assessment: Contraction strength is mild.  Contraction frequency is regular.   Cervix: Cervix evaluated by digital exam.     Fetal Exam Fetal Monitor Review: Mode: ultrasound.   Baseline rate: 135.  Variability: moderate (6-25 bpm).   Pattern: accelerations present and no decelerations.    Fetal State Assessment: Category I - tracings are normal.     Physical Exam  Nursing note and vitals reviewed. Constitutional: She is oriented to person, place, and time. She appears well-developed and well-nourished.  Neck: Normal range of motion.  Cardiovascular: Normal rate, regular rhythm and normal heart sounds.   Respiratory: Effort normal and breath sounds normal.  GI: Soft.  Musculoskeletal: Normal range of motion.  Neurological: She is alert and oriented to person, place, and time.  Skin: Skin is warm and dry.  Psychiatric: She has a normal mood and affect. Her behavior is normal. Judgment and thought content normal.    Prenatal labs: ABO, Rh: O/Positive/-- (06/25 1019) Antibody: Negative (06/25 1019) Rubella: 18.40 (06/25 1019) RPR: Non Reactive (08/07 1042)  HBsAg: Negative (06/25 1019)  HIV:    GBS: Negative (10/03 1616)   Assessment/Plan: G1P0@[redacted]w[redacted]d  ROM at term, likely early labor IUGR <10%tile, normal dopplers on 10/24 GBS negative  Admit to [facility] Expectant management on admission, consider augmentation as needed Pt desires natural labor, encouraged to move around,  walk, use water, position changes as needed May saline lock, intermittently monitor as desired Anticipate NSVD  Sharen Counter 06/11/2017, 11:11 AM

## 2017-06-11 NOTE — Anesthesia Procedure Notes (Signed)
Epidural Patient location during procedure: OB Start time: 06/11/2017 6:22 PM End time: 06/11/2017 6:25 PM  Staffing Anesthesiologist: Leilani AbleHATCHETT, Rania Prothero Performed: anesthesiologist   Preanesthetic Checklist Completed: patient identified, site marked, surgical consent, pre-op evaluation, timeout performed, IV checked, risks and benefits discussed and monitors and equipment checked  Epidural Patient position: sitting Prep: site prepped and draped and DuraPrep Patient monitoring: continuous pulse ox and blood pressure Approach: midline Location: L3-L4 Injection technique: LOR air  Needle:  Needle type: Tuohy  Needle gauge: 17 G Needle length: 9 cm and 9 Needle insertion depth: 6 cm Catheter type: closed end flexible Catheter size: 19 Gauge Catheter at skin depth: 11 cm Test dose: negative and Other  Assessment Sensory level: T9 Events: blood not aspirated, injection not painful, no injection resistance, negative IV test and no paresthesia

## 2017-06-11 NOTE — MAU Note (Signed)
water broke about 0830, clear fluid - still coming.  Has a pad on . No bleeding. Having contractions, every 10-15 min. Was 1 cm on Wed.

## 2017-06-11 NOTE — MAU Note (Signed)
Pt presents with c/o leaking fluid since 0840 this am, reports fluid is clear.  Reports ctxs approximately 10-15 minutes aprt.  States +FM.

## 2017-06-11 NOTE — Progress Notes (Signed)
Labor Progress Note Kendra Francis is a 21 y.o. G1P0 at 5726w3d presented for SROM. S:  Patient reporting more painful contractions every 5 minutes. Reports coping well with yoga ball and walking in room.  O:  BP 130/79   Pulse (!) 111   Temp 98.2 F (36.8 C) (Oral)   Resp 16   Ht 5\' 2"  (1.575 m)   Wt 153 lb (69.4 kg)   LMP 09/03/2016 (Approximate)   SpO2 100%   BMI 27.98 kg/m   Fetal Tracing:  Baseline: 140 Variability: moderate Accels: 15x15 Decels: none  Toco: 5-7   CVE: Dilation: 3 Effacement (%): 80 Station: -3 Presentation: Vertex Exam by:: C Francis CNM   A&P: 21 y.o. G1P0 5226w3d presenting for SROM #Labor: No cervical change in 4 hours since admission. Will start pitocin #Pain: n/a #FWB: Cat 1 #GBS negative  Kendra Francis, [place] 4:34 PM 06/11/17

## 2017-06-11 NOTE — Progress Notes (Signed)
Labor Progress Note Kendra Francis is a 21 y.o. G1P0 at 3932w3d presented for SROM Kendra:  Patient comfortable with epidural.   O:  BP 128/79   Pulse 93   Temp 98.1 F (36.7 C) (Oral)   Resp 16   Ht 5\' 2"  (1.575 m)   Wt 153 lb (69.4 kg)   LMP 09/03/2016 (Approximate)   SpO2 99%   BMI 27.98 kg/m   Fetal Tracing:  Baseline: 135 Variability: moderate Accels: 10x10 Decels: none  Toco: 2-6  CVE: Dilation: 6 Effacement (%): 100 Station: 0, +1 Presentation: Vertex Exam by:: Kendra Nix RN   A&P: 21 y.o. G1P0 3732w3d SROM #Labor: Progressing well. Continue pitocin #Pain: epidural #FWB: Cat 1 #GBS negative  Rolm BookbinderCaroline M Neill, [place] 7:24 PM 06/11/17

## 2017-06-12 ENCOUNTER — Encounter (HOSPITAL_COMMUNITY): Payer: Self-pay

## 2017-06-12 LAB — RPR: RPR Ser Ql: NONREACTIVE

## 2017-06-12 MED ORDER — ZOLPIDEM TARTRATE 5 MG PO TABS: 5.0000 mg | ORAL_TABLET | Freq: Every evening | ORAL | Status: DC | PRN

## 2017-06-12 MED ORDER — SENNOSIDES-DOCUSATE SODIUM 8.6-50 MG PO TABS
2.0000 | ORAL_TABLET | ORAL | Status: DC
  Administered 2017-06-12: 2 via ORAL
  Filled 2017-06-12: qty 2

## 2017-06-12 MED ORDER — PRENATAL MULTIVITAMIN CH
1.0000 | ORAL_TABLET | Freq: Every day | ORAL | Status: DC
  Administered 2017-06-12 – 2017-06-13 (×2): 1 via ORAL
  Filled 2017-06-12 (×2): qty 1

## 2017-06-12 MED ORDER — WITCH HAZEL-GLYCERIN EX PADS: 1.0000 "application " | MEDICATED_PAD | CUTANEOUS | Status: DC | PRN

## 2017-06-12 MED ORDER — COCONUT OIL OIL: 1.0000 "application " | TOPICAL_OIL | Status: DC | PRN

## 2017-06-12 MED ORDER — DIBUCAINE 1 % RE OINT: 1.0000 "application " | TOPICAL_OINTMENT | RECTAL | Status: DC | PRN

## 2017-06-12 MED ORDER — ONDANSETRON HCL 4 MG PO TABS: 4.0000 mg | ORAL_TABLET | ORAL | Status: DC | PRN

## 2017-06-12 MED ORDER — SIMETHICONE 80 MG PO CHEW
80.0000 mg | CHEWABLE_TABLET | ORAL | Status: DC | PRN
Start: 2017-06-12 — End: 2017-06-13

## 2017-06-12 MED ORDER — TETANUS-DIPHTH-ACELL PERTUSSIS 5-2.5-18.5 LF-MCG/0.5 IM SUSP: 0.5000 mL | Freq: Once | INTRAMUSCULAR | Status: DC

## 2017-06-12 MED ORDER — BENZOCAINE-MENTHOL 20-0.5 % EX AERO
1.0000 "application " | INHALATION_SPRAY | CUTANEOUS | Status: DC | PRN
  Administered 2017-06-12 – 2017-06-13 (×2): 1 via TOPICAL
  Filled 2017-06-12 (×2): qty 56

## 2017-06-12 MED ORDER — DIPHENHYDRAMINE HCL 25 MG PO CAPS: 25.0000 mg | ORAL_CAPSULE | Freq: Four times a day (QID) | ORAL | Status: DC | PRN

## 2017-06-12 MED ORDER — ONDANSETRON HCL 4 MG/2ML IJ SOLN
4.0000 mg | INTRAMUSCULAR | Status: DC | PRN
Start: 2017-06-12 — End: 2017-06-13

## 2017-06-12 NOTE — Progress Notes (Signed)
Post Partum Day 1 Subjective: no complaints, up ad lib, voiding and tolerating PO  Objective: Blood pressure 107/71, pulse 94, temperature 98.4 F (36.9 C), temperature source Oral, resp. rate 18, height 5\' 2"  (1.575 m), weight 153 lb (69.4 kg), last menstrual period 09/03/2016, SpO2 98 %, unknown if currently breastfeeding.  Physical Exam:  General: alert and no distress Lochia: appropriate Uterine Fundus: firm Incision: healing well DVT Evaluation: No evidence of DVT seen on physical exam.   Recent Labs  06/11/17 1100  HGB 12.6  HCT 37.4    Assessment/Plan: Plan for discharge tomorrow and Breastfeeding   LOS: 1 day   Kendra Francis 06/12/2017, 12:12 PM

## 2017-06-12 NOTE — Anesthesia Postprocedure Evaluation (Signed)
Anesthesia Post Note  Patient: Passenger transport managerAngel Francis  Procedure(s) Performed: AN AD HOC LABOR EPIDURAL     Patient location during evaluation: Mother Baby Anesthesia Type: Epidural Level of consciousness: awake and alert Pain management: pain level controlled Vital Signs Assessment: post-procedure vital signs reviewed and stable Respiratory status: spontaneous breathing, nonlabored ventilation and respiratory function stable Cardiovascular status: stable Postop Assessment: no headache, no backache, epidural receding and patient able to bend at knees Anesthetic complications: no    Last Vitals:  Vitals:   06/12/17 0105 06/12/17 0500  BP: 132/76 107/71  Pulse: 93 94  Resp: 20 18  Temp: 37.6 C 36.9 C  SpO2:      Last Pain:  Vitals:   06/12/17 0502  TempSrc:   PainSc: 0-No pain   Pain Goal:                 Rica RecordsICKELTON,Dewayne Severe

## 2017-06-13 MED ORDER — IBUPROFEN 600 MG PO TABS: 600.0000 mg | ORAL_TABLET | Freq: Four times a day (QID) | ORAL | 0 refills | Status: DC

## 2017-06-13 MED ORDER — SENNOSIDES-DOCUSATE SODIUM 8.6-50 MG PO TABS: 2.0000 | ORAL_TABLET | ORAL | 0 refills | Status: DC

## 2017-06-13 NOTE — Lactation Note (Addendum)
This note was copied from a baby's chart. Lactation Consultation Note  Patient Name: Girl Sabino Donovanngel Bresnan ZDGUY'QToday's Date: 06/13/2017 Reason for consult: Initial assessment;Primapara;1st time breastfeeding;Early term 37-38.6wks;Infant < 6lbs;Infant weight loss  Baby is 2939 hours old, early term infant,  Per mom she has been leaking milk in the last month of pregnancy  As LC entered the room, mom finger feeding EBM from a syringe.  Baby awake, and fussy, LC checked diaper/ large wet and large stool greenish filling the entire diaper.  LC undressed the baby and assisted mom to latch on the left breast / cross cradle/ and showed dad how her could  Assist mom to obtain the depth , and showed mom breast compressions. Baby fed for 20 mins , consistent swallowing pattern,  Multiple swallows, increased with breast compressions, breast soften, and baby released , nipple well rounded.  LC reviewed steps for latching, and the importance of feeding STS every feeding until the baby could stay awake for a feeding,  Feeding cues, body signs for being hungry and full. Nutritive vs non - nutritive feeding cues, and to watch for hanging out when latched.  LC reviewed potential feeding cues, mom denies soreness with latching , just some with the pump.  Per mom has  DEBP - Medela at home . LC recommended feeding on the 1st breast softening well. And pumping the other breast.  Sore nipple and engorgement prevention and tx reviewed.  Mom and dad asked appropriate breast feeding questions.  Mom receptive to returning back [facility]. This week , LC placed a request on the Basket in the clinic box in [facility].     Maternal Data Has patient been taught Hand Expression?: Yes Does the patient have breastfeeding experience prior to this delivery?: No  Feeding Feeding Type: Breast Fed Length of feed: 20 min (depth and multiple swallows noted )  LATCH Score Latch: Grasps breast easily, tongue down, lips flanged,  rhythmical sucking.  Audible Swallowing: Spontaneous and intermittent  Type of Nipple: Everted at rest and after stimulation  Comfort (Breast/Nipple): Filling, red/small blisters or bruises, mild/mod discomfort  Hold (Positioning): Assistance needed to correctly position infant at breast and maintain latch.  LATCH Score: 8  Interventions Interventions: Breast feeding basics reviewed;Assisted with latch;Skin to skin;Breast massage;Breast compression;Adjust position;Support pillows;Position options;Expressed milk;Shells;Hand pump;DEBP  Lactation Tools Discussed/Used Tools: Shells;Pump Shell Type: Inverted Breast pump type: Manual;Double-Electric Breast Pump WIC Program: Yes (per mom ) Pump Review: Setup, frequency, and cleaning;Milk Storage Initiated by:: LC reviewed    Consult Status Consult Status: Follow-up Date:  (LC O/P when clinic calls mom ) Follow-up type: Out-patient    Matilde SprangMargaret Ann Irini Leet 06/13/2017, 1:20 PM

## 2017-06-13 NOTE — Discharge Summary (Signed)
OB Discharge Summary     Patient Name: Kendra Francis DOB: August 10, 1996 MRN: [id number]  Date of admission: 06/11/2017 Delivering MD: Cam Hai D   Date of discharge: 06/13/2017  Admitting diagnosis: WATERH BROKE Intrauterine pregnancy: [redacted]w[redacted]d     Secondary diagnosis:  Active Problems:   Normal labor   SVD (spontaneous vaginal delivery)  Additional problems: -Late prenatal care -IUGR -Low vitamin D levels     Discharge diagnosis: Term Pregnancy Delivered                                                                                                Post partum procedures:none  Augmentation: Pitocin  Complications: None  Hospital course:  Onset of Labor With Vaginal Delivery     21 y.o. yo G1P1001 at [redacted]w[redacted]d was admitted with PROM on 06/11/2017. Patient had an uncomplicated labor course as follows:  Membrane Rupture Time/Date: 8:40 AM ,06/11/2017   Intrapartum Procedures: Episiotomy: None [1]                                         Lacerations:  None [1]  Patient had a delivery of a Viable infant. 06/11/2017  Information for the patient's newborn:  Saron, Vanorman Girl Glada [[id number]]  Delivery Method: Vag-Spont    Pateint had an uncomplicated postpartum course.  She is ambulating, tolerating a regular diet, passing flatus, and urinating well. Patient is discharged home in stable condition on 06/13/17.  Physical exam  Vitals:   06/12/17 0000 06/12/17 0105 06/12/17 0500 06/13/17 0531  BP: 129/68 132/76 107/71 (!) 102/49  Pulse: (!) 118 93 94 72  Resp: 18 20 18 18   Temp: (!) 100.5 F (38.1 C) 99.7 F (37.6 C) 98.4 F (36.9 C) 98.3 F (36.8 C)  TempSrc: Oral Oral Oral Oral  SpO2: 98%     Weight:      Height:       General: alert, cooperative and no distress Lochia: appropriate Uterine Fundus: firm Incision: N/A DVT Evaluation: No evidence of DVT seen on physical exam. Labs: Lab Results  Component Value Date   WBC 10.1 06/11/2017   HGB 12.6 06/11/2017   HCT 37.4 06/11/2017   MCV 95.9 06/11/2017   PLT 269 06/11/2017   No flowsheet data found.  Discharge instruction: per After Visit Summary and "Baby and Me Booklet".  After visit meds:  Allergies as of 06/13/2017   No Known Allergies     Medication List    STOP taking these medications   acetaminophen 500 MG tablet Commonly known as:  TYLENOL     TAKE these medications   ibuprofen 600 MG tablet Commonly known as:  ADVIL,MOTRIN Take 1 tablet (600 mg total) by mouth every 6 (six) hours.   PRENATE PIXIE 10-0.6-0.4-200 MG Caps Take 1 tablet by mouth daily.   senna-docusate 8.6-50 MG tablet Commonly known as:  Senokot-S Take 2 tablets by mouth daily.   Vitamin D (Ergocalciferol) 50000 units Caps capsule Commonly known as:  DRISDOL Take 1 capsule (  50,000 Units total) by mouth every 7 (seven) days.       Diet: routine diet  Activity: Advance as tolerated. Pelvic rest for 6 weeks.   Outpatient follow up:4 weeks Follow up Appt: Future Appointments Date Time Provider Department Center  07/14/2017 9:00 AM Orvilla Cornwallenney, Rachelle A, CNM [facility] None   Follow up Visit:No Follow-up on file.  Postpartum contraception: Condoms  Newborn Data: Live born female  Birth Weight: 5 lb 4.7 oz (2401 g) APGAR: 8, 9  Newborn Delivery   Birth date/time:  06/11/2017 22:04:00 Delivery type:  Vaginal, Spontaneous Delivery      Baby Feeding: Breast Disposition:home with mother   06/13/2017 Kendra BroadKeriann S Minott, MD  PGY-1  OB FELLOW DISCHARGE ATTESTATION  I have seen and examined this patient. I agree with above documentation and have made edits as needed.   Kendra AdaJazma Adaleen Hulgan, DO OB Fellow 9:22 AM

## 2017-06-13 NOTE — Discharge Instructions (Signed)
Breast Pumping Tips If you are breastfeeding, there may be times when you cannot feed your baby directly. Returning to work or going on a trip are examples. Pumping allows you to store breast milk and feed it to your baby later. You may not get much milk when you first start to pump. Your breasts should start to make more after a few days. If you pump at the times you usually feed your baby, you may be able to keep making enough milk to feed your baby without also using formula. The more often you pump, the more milk your body will make. When should I pump?  You can start to pump soon after you have your baby. Ask your doctor what is right for you and your baby.  If you are going back to work, start pumping a few weeks before. This gives you time to learn how to pump and to store a supply of milk.  When you are with your baby, feed your baby when he or she is hungry. Pump after each feeding.  When you are away from your baby for many hours, pump for about 15 minutes every 2-3 hours. Pump both breasts at the same time if you can.  If your baby has a formula feeding, make sure to pump close to the same time.  If you drink any alcohol, wait 2 hours before pumping. How do I get ready to pump? Your let-down reflex is your body's natural reaction that makes your breast milk flow. It is easier to make your breast milk flow when you are relaxed. Try these things to help you relax:  Smell one of your baby's blankets or an item of clothing.  Look at a picture or video of your baby.  Sit in a quiet, private space.  Massage the breast you plan to pump.  Place soothing warmth on the breast.  Play relaxing music.  What are some breast pumping tips?  Wash your hands before you pump. You do not need to wash your nipples or breasts.  There are three ways to pump. You can: ? Use your hand to massage and squeeze your breast. ? Use a handheld manual pump. ? Use an electric pump.  Make sure the  suction cup on the breast pump is the right size. Place the suction cup directly over the nipple. It can be painful or hurt your nipple if it is the wrong size or placed wrong.  Put a small amount of purified or modified lanolin on your nipple and areola if you are sore.  If you are using an electric pump, change the speed and suction power to be more comfortable.  You may need a different type of pump if pumping hurts or you do not get a lot of milk. Your doctor can help you pick what type of pump to use.  Keep a full water bottle near you always. Drinking lots of fluid helps you make more milk.  You can store your milk to use later. Pumped breast milk can be stored in a sealable, sterile container or plastic bag. Always put the date you pumped it on the container. ? Milk can stay out at room temperature for up to 8 hours. ? You can store your milk in the refrigerator for up to 8 days. ? You can store your milk in the freezer for 3 months. Thaw frozen milk using warm water. Do not put it in the microwave.  Do not smoke. Ask  your doctor for help. When should I call my doctor?  You have a hard time pumping.  You are worried you do not make enough milk.  You have nipple pain, soreness, or redness.  You want to take birth control pills. This information is not intended to replace advice given to you by your health care provider. Make sure you discuss any questions you have with your health care provider. Document Released: 01/20/2008 Document Revised: 01/09/2016 Document Reviewed: 05/26/2013 Elsevier Interactive Patient Education  2017 Elsevier Inc.   Breast Pumping Tips If you are breastfeeding, there may be times when you cannot feed your baby directly. Returning to work or going on a trip are common examples. Pumping allows you to store breast milk and feed it to your baby later. You may not get much milk when you first start to pump. Your breasts should start to make more after a  few days. If you pump at the times you usually feed your baby, you may be able to keep making enough milk to feed your baby without also using formula. The more often you pump, the more milk you will produce. When should I pump?  You can begin to pump soon after delivery. However, some experts recommend waiting about 4 weeks before giving your infant a bottle to make sure breastfeeding is going well.  If you plan to return to work, begin pumping a few weeks before. This will help you develop techniques that work best for you. It also lets you build up a supply of breast milk.  When you are with your infant, feed on demand and pump after each feeding.  When you are away from your infant for several hours, pump for about 15 minutes every 2-3 hours. Pump both breasts at the same time if you can.  If your infant has a formula feeding, make sure to pump around the same time.  If you drink any alcohol, wait 2 hours before pumping. How do I prepare to pump? Your let-down reflexis the natural reaction to stimulation that makes your breast milk flow. It is easier to stimulate this reflex when you are relaxed. Find relaxation techniques that work for you. If you have difficulty with your let-down reflex, try these methods:  Smell one of your infant's blankets or an item of clothing.  Look at a picture or video of your infant.  Sit in a quiet, private space.  Massage the breast you plan to pump.  Place soothing warmth on the breast.  Play relaxing music.  What are some general breast pumping tips?  Wash your hands before you pump. You do not need to wash your nipples or breasts.  There are three ways to pump. ? You can use your hand to massage and compress your breast. ? You can use a handheld manual pump. ? You can use an electric pump.  Make sure the suction cup (flange) on the breast pump is the right size. Place the flange directly over the nipple. If it is the wrong size or placed  the wrong way, it may be painful and cause nipple damage.  If pumping is uncomfortable, apply a small amount of purified or modified lanolin to your nipple and areola.  If you are using an electric pump, adjust the speed and suction power to be more comfortable.  If pumping is painful or if you are not getting very much milk, you may need a different type of pump. A lactation consultant can help  you determine what type of pump to use.  Keep a full water bottle near you at all times. Drinking lots of fluid helps you make more milk.  You can store your milk to use later. Pumped breast milk can be stored in a sealable, sterile container or plastic bag. Label all stored breast milk with the date you pumped it. ? Milk can stay out at room temperature for up to 8 hours. ? You can store your milk in the refrigerator for up to 8 days. ? You can store your milk in the freezer for 3 months. Thaw frozen milk using warm water. Do not put it in the microwave.  Do not smoke. Smoking can lower your milk supply and harm your infant. If you need help quitting, ask your health care provider to recommend a program. When should I call my health care provider or a lactation consultant?  You are having trouble pumping.  You are concerned that you are not making enough milk.  You have nipple pain, soreness, or redness.  You want to use birth control. Birth control pills may lower your milk supply. Talk to your health care provider about your options. This information is not intended to replace advice given to you by your health care provider. Make sure you discuss any questions you have with your health care provider. Document Released: 01/21/2010 Document Revised: 01/15/2016 Document Reviewed: 05/26/2013 Elsevier Interactive Patient Education  2017 Elsevier Inc.  Home Care Instructions for Mom ACTIVITY  Gradually return to your regular activities.  Let yourself rest. Nap while your baby sleeps.  Avoid  lifting anything that is heavier than 10 lb (4.5 kg) until your health care provider says it is okay.  Avoid activities that take a lot of effort and energy (are strenuous) until approved by your health care provider. Walking at a slow-to-moderate pace is usually safe.  If you had a cesarean delivery: ? Do not vacuum, climb stairs, or drive a car for 4-6 weeks. ? Have someone help you at home until you feel like you can do your usual activities yourself. ? Do exercises as told by your health care provider, if this applies.  VAGINAL BLEEDING You may continue to bleed for 4-6 weeks after delivery. Over time, the amount of blood usually decreases and the color of the blood usually gets lighter. However, the flow of bright red blood may increase if you have been too active. If you need to use more than one pad in an hour because your pad gets soaked, or if you pass a large clot:  Lie down.  Raise your feet.  Place a cold compress on your lower abdomen.  Rest.  Call your health care provider.  If you are breastfeeding, your period should return anytime between 8 weeks after delivery and the time that you stop breastfeeding. If you are not breastfeeding, your period should return 6-8 weeks after delivery. PERINEAL CARE The perineal area, or perineum, is the part of your body between your thighs. After delivery, this area needs special care. Follow these instructions as told by your health care provider.  Take warm tub baths for 15-20 minutes.  Use medicated pads and pain-relieving sprays and creams as told.  Do not use tampons or douches until vaginal bleeding has stopped.  Each time you go to the bathroom: ? Use a peri bottle. ? Change your pad. ? Use towelettes in place of toilet paper until your stitches have healed.  Do Kegel exercises every  day. Kegel exercises help to maintain the muscles that support the vagina, bladder, and bowels. You can do these exercises while you are  standing, sitting, or lying down. To do Kegel exercises: ? Tighten the muscles of your abdomen and the muscles that surround your birth canal. ? Hold for a few seconds. ? Relax. ? Repeat until you have done this 5 times in a row.  To prevent hemorrhoids from developing or getting worse: ? Drink enough fluid to keep your urine clear or pale yellow. ? Avoid straining when having a bowel movement. ? Take over-the-counter medicines and stool softeners as told by your health care provider.  BREAST CARE  Wear a tight-fitting bra.  Avoid taking over-the-counter pain medicine for breast discomfort.  Apply ice to the breasts to help with discomfort as needed: ? Put ice in a plastic bag. ? Place a towel between your skin and the bag. ? Leave the ice on for 20 minutes or as told by your health care provider.  NUTRITION  Eat a well-balanced diet.  Do not try to lose weight quickly by cutting back on calories.  Take your prenatal vitamins until your postpartum checkup or until your health care provider tells you to stop.  POSTPARTUM DEPRESSION You may find yourself crying for no apparent reason and unable to cope with all of the changes that come with having a newborn. This mood is called postpartum depression. Postpartum depression happens because your hormone levels change after delivery. If you have postpartum depression, get support from your partner, friends, and family. If the depression does not go away on its own after several weeks, contact your health care provider. BREAST SELF-EXAM Do a breast self-exam each month, at the same time of the month. If you are breastfeeding, check your breasts just after a feeding, when your breasts are less full. If you are breastfeeding and your period has started, check your breasts on day 5, 6, or 7 of your period. Report any lumps, bumps, or discharge to your health care provider. Know that breasts are normally lumpy if you are breastfeeding. This is  temporary, and it is not a health risk. INTIMACY AND SEXUALITY Avoid sexual activity for at least 3-4 weeks after delivery or until the brownish-red vaginal flow is completely gone. If you want to avoid pregnancy, use some form of birth control. You can get pregnant after delivery, even if you have not had your period. SEEK MEDICAL CARE IF:  You feel unable to cope with the changes that a child brings to your life, and these feelings do not go away after several weeks.  You notice a lump, a bump, or discharge on your breast.  SEEK IMMEDIATE MEDICAL CARE IF:  Blood soaks your pad in 1 hour or less.  You have: ? Severe pain or cramping in your lower abdomen. ? A bad-smelling vaginal discharge. ? A fever that is not controlled by medicine. ? A fever, and an area of your breast is red and sore. ? Pain or redness in your calf. ? Sudden, severe chest pain. ? Shortness of breath. ? Painful or bloody urination. ? Problems with your vision.  You vomit for 12 hours or longer.  You develop a severe headache.  You have serious thoughts about hurting yourself, your child, or anyone else.  This information is not intended to replace advice given to you by your health care provider. Make sure you discuss any questions you have with your health care provider. Document  Released: 07/31/2000 Document Revised: 01/09/2016 Document Reviewed: 02/04/2015 Elsevier Interactive Patient Education  2017 [employer]

## 2017-06-15 ENCOUNTER — Inpatient Hospital Stay (HOSPITAL_COMMUNITY): Admission: RE | Admit: 2017-06-15 | Payer: Medicaid Other | Source: Ambulatory Visit

## 2017-07-14 ENCOUNTER — Encounter: Payer: Self-pay | Admitting: Certified Nurse Midwife

## 2017-07-14 ENCOUNTER — Ambulatory Visit (INDEPENDENT_AMBULATORY_CARE_PROVIDER_SITE_OTHER): Payer: Medicaid Other | Admitting: Certified Nurse Midwife

## 2017-07-14 NOTE — Progress Notes (Signed)
Post Partum Exam  Kendra Francis is a 21 y.o. 261P1001 female who presents for a postpartum visit. She is 4 weeks postpartum following a spontaneous vaginal delivery. I have fully reviewed the prenatal and intrapartum course. The delivery was at 38 and 3 gestational weeks.  Anesthesia: epidural. Postpartum course has been good. Baby's course has been great. Baby is feeding by breast. Bleeding no bleeding. Bowel function is normal. Bladder function is normal. Patient is not sexually active. Contraception method is condoms and charting ovulation. Postpartum depression screening: Negative  The following portions of the patient's history were reviewed and updated as appropriate: allergies, current medications, past family history, past medical history, past social history, past surgical history and problem list.  Review of Systems Pertinent items noted in HPI and remainder of comprehensive ROS otherwise negative.    Objective:  Last menstrual period 09/03/2016, unknown if currently breastfeeding.  General:  alert, cooperative and no distress   Breasts:  inspection negative, no nipple discharge or bleeding, no masses or nodularity palpable  Lungs: clear to auscultation bilaterally  Heart:  regular rate and rhythm, S1, S2 normal, no murmur, click, rub or gallop  Abdomen: soft, non-tender; bowel sounds normal; no masses,  no organomegaly  Pelvic Exam: Not performed.        Assessment:    Normal 4 week postpartum exam. Pap smear not done at today's visit (not indicated).  Plan:   1. Contraception: condoms 2. Follow up in: 6 months for annual exam or as needed.

## 2018-01-28 IMAGING — US US MFM OB TRANSVAGINAL
1 series · 13 of 28 positions shown · non-contrast
Comparison: none

[Series 1: us mfm ob transvaginal · 72 acquisitions, 13 frames shown]
[im 3/72]
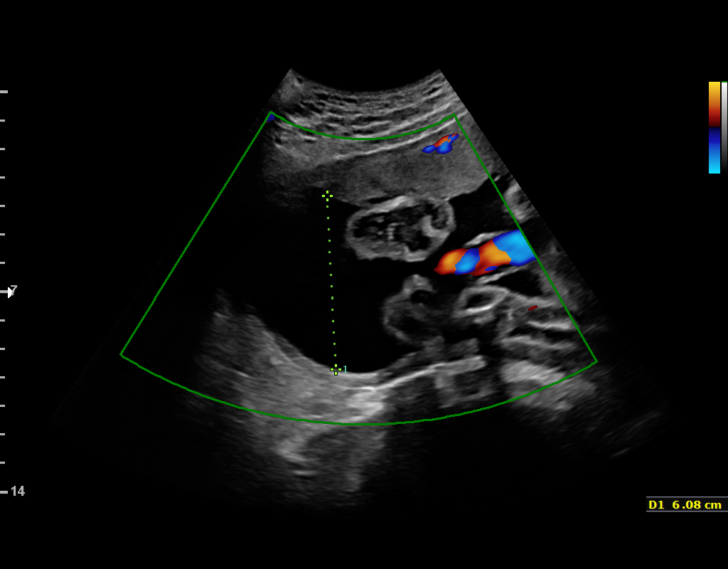
[im 8/72]
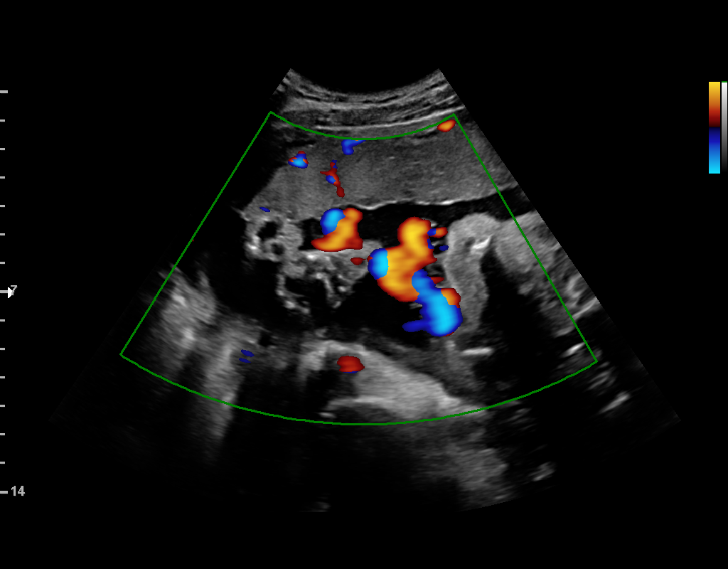
[im 14/72]
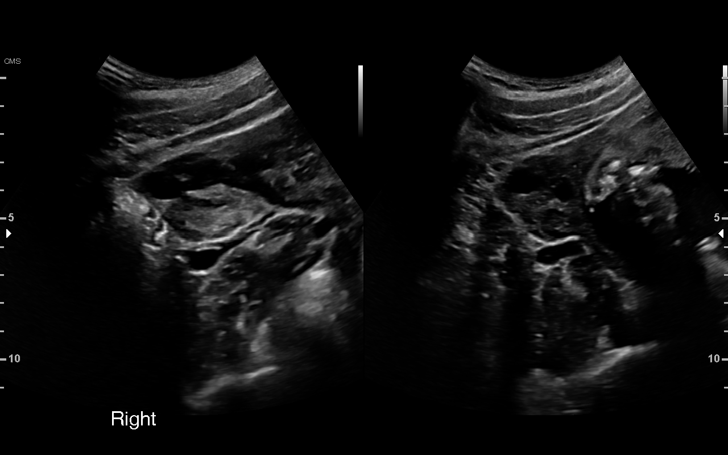
[im 19/72]
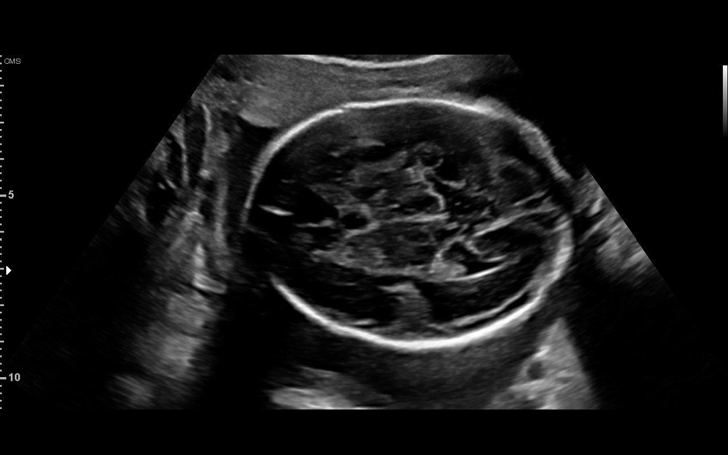
[im 24/72]
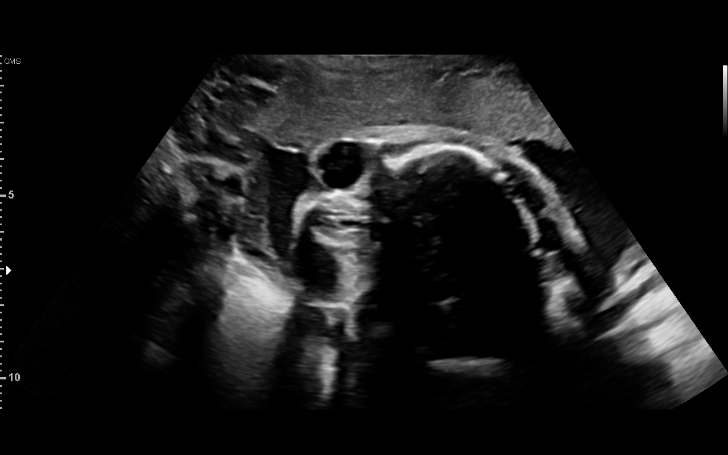
[im 29/72]
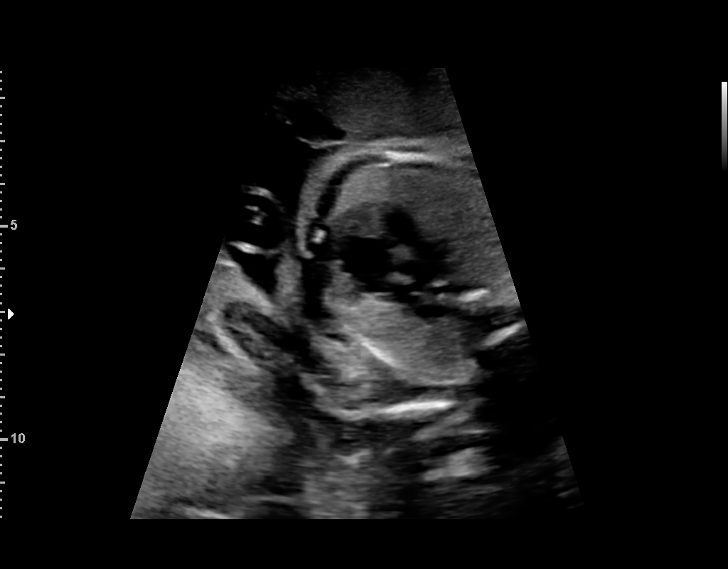
[im 37/72]
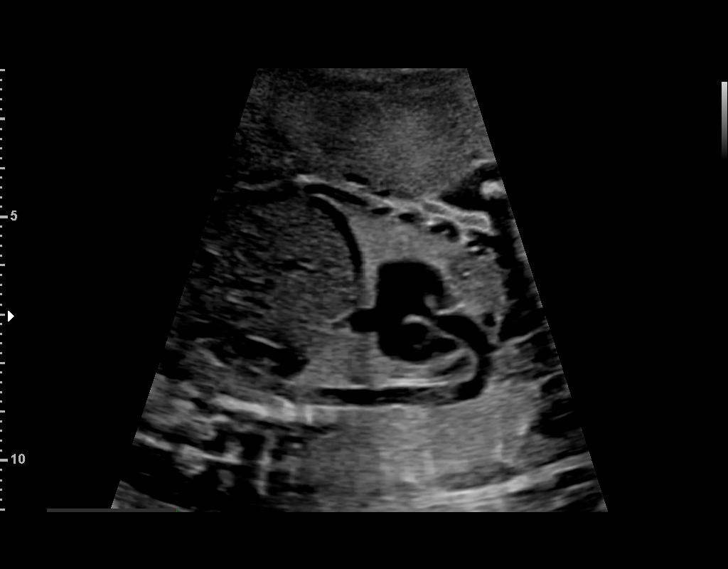
[im 43/72]
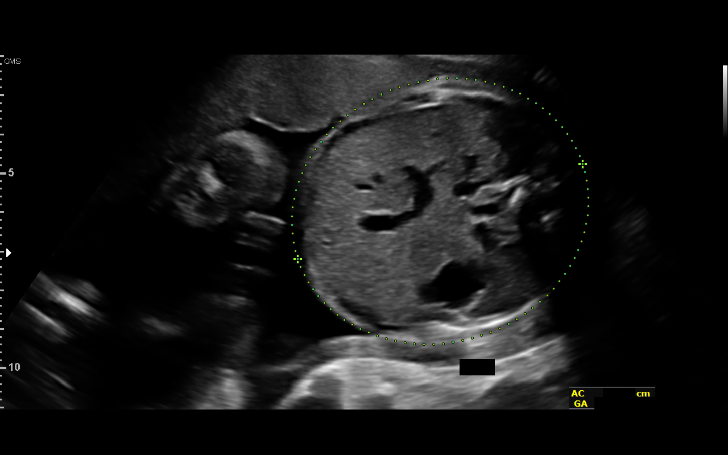
[im 48/72]
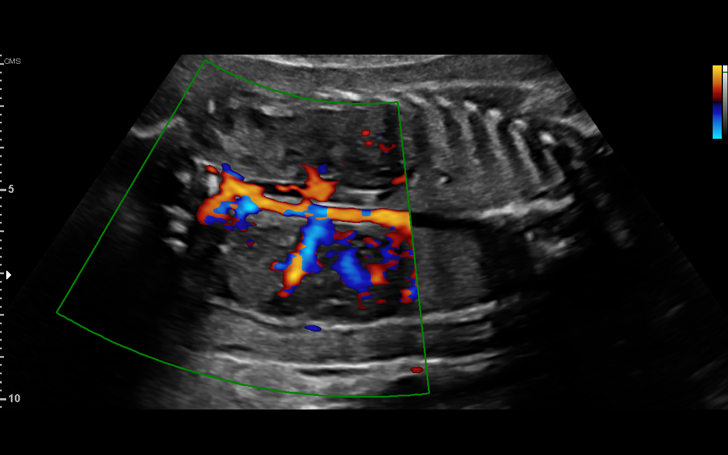
[im 53/72]
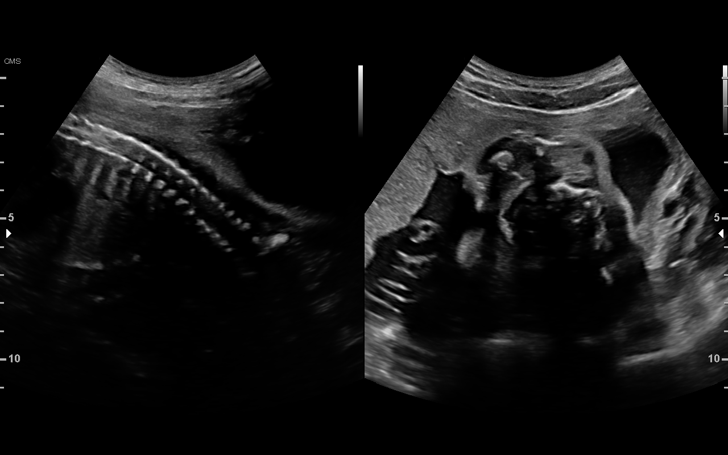
[im 58/72]
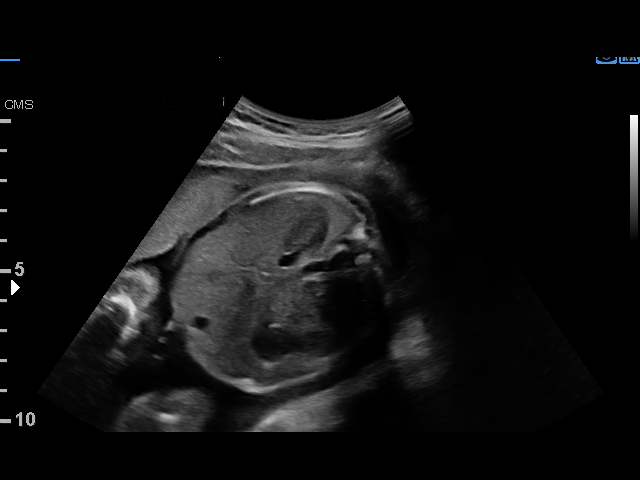
[im 64/72]
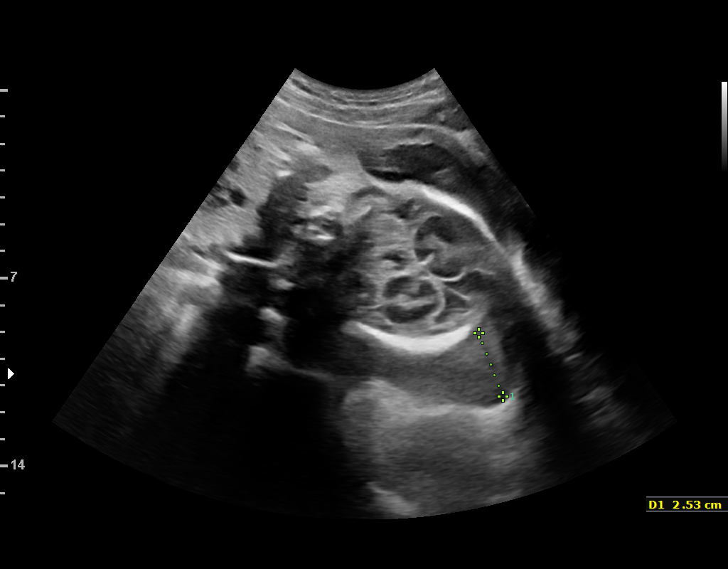
[im 69/72]
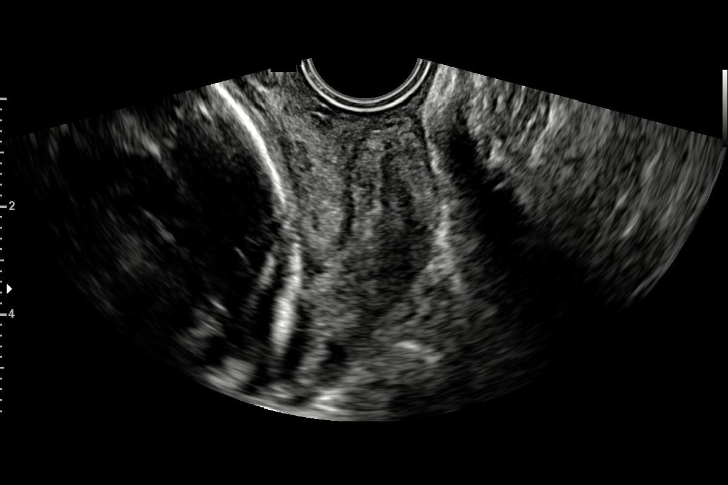

[13 of 28 positions shown; findings below may reference images not displayed]

Road [HOSPITAL]

Indications

27 weeks gestation of pregnancy
Asthma                                         D88.X8 j21.323
Late prenatal care, second trimester
Antenatal follow-up for nonvisualized fetal
anatomy
Encounter for cervical length
OB History

Blood Type:            Height:  5'2"   Weight (lb):  128       BMI:
Gravidity:    1
Fetal Evaluation

Num Of Fetuses:     1
Fetal Heart         149
Rate(bpm):
Cardiac Activity:   Observed
Presentation:       Cephalic
Placenta:           Anterior, above cervical os
P. Cord Insertion:  Visualized

Amniotic Fluid
AFI FV:      Subjectively within normal limits

Largest Pocket(cm)
6.08
Biometry
BPD:        67  mm     G. Age:  27w 0d         29  %    CI:        70.83   %    70 - 86
FL/HC:      18.3   %    18.6 -
HC:      253.7  mm     G. Age:  27w 4d         30  %    HC/AC:      1.12        1.05 -
AC:      226.4  mm     G. Age:  27w 0d         35  %    FL/BPD:     69.3   %    71 - 87
FL:       46.4  mm     G. Age:  25w 3d        < 3  %    FL/AC:      20.5   %    20 - 24
HUM:      42.8  mm     G. Age:  25w 4d          8  %
CER:      33.4  mm     G. Age:  29w 1d         79  %

CM:        7.5  mm

Est. FW:     947  gm      2 lb 1 oz     36  %
Gestational Age

LMP:           29w 0d        Date:  09/03/16                 EDD:   06/10/17
U/S Today:     26w 5d                                        EDD:   06/26/17
Best:          27w 2d     Det. By:  U/S  (02/18/17)          EDD:   06/22/17
Anatomy

Cranium:               Appears normal         Aortic Arch:            Appears normal
Cavum:                 Appears normal         Ductal Arch:            Previously seen
Ventricles:            Appears normal         Diaphragm:              Appears normal
Choroid Plexus:        Appears normal         Stomach:                Appears normal, left
sided
Cerebellum:            Appears normal         Abdomen:                Appears normal
Posterior Fossa:       Appears normal         Abdominal Wall:         Appears nml (cord
insert, abd wall)
Nuchal Fold:           Not applicable (>20    Cord Vessels:           Appears normal (3
wks GA)                                        vessel cord)
Face:                  Appears normal         Kidneys:                Appear normal
(orbits and profile)
Lips:                  Appears normal         Bladder:                Appears normal
Thoracic:              Appears normal         Spine:                  Appears normal
Heart:                 Appears normal         Upper Extremities:      Previously seen
(4CH, axis, and situs
RVOT:                  Appears normal         Lower Extremities:      Previously seen
LVOT:                  Appears normal

Other:  Fetus appears to be a female. Heels and LT 5th digit previously
visualized. Technically difficult due to fetal position.
Cervix Uterus Adnexa

Cervix
Length:              3  cm.
Measured transvaginally.

Uterus
No abnormality visualized.

Left Ovary
Within normal limits.

Right Ovary
Within normal limits.

Cul De Sac:   No free fluid seen.

Adnexa:       No abnormality visualized.
Impression

Singleton intrauterine pregnancy at 27+2 weeks, here to
complete anatomic survey; transvaginal cervical length
requested
Interval review of the anatomy shows no sonographic
markers for aneuploidy or structural anomalies
All relevant anatomy has been visualized
Amniotic fluid volume is normal
Estimated fetal weight is 947g which is growth in the 36th
percentile
Transvaginal cervical length is 30mm
Recommendations

Follow-up ultrasounds as clinically indicated.

## 2018-04-07 IMAGING — US US MFM UA CORD DOPPLER
1 series · 14 of 28 positions shown · non-contrast
Comparison: none

[Series 1: us mfm ua cord doppler · 39 acquisitions, 14 frames shown]
[im 2/39]
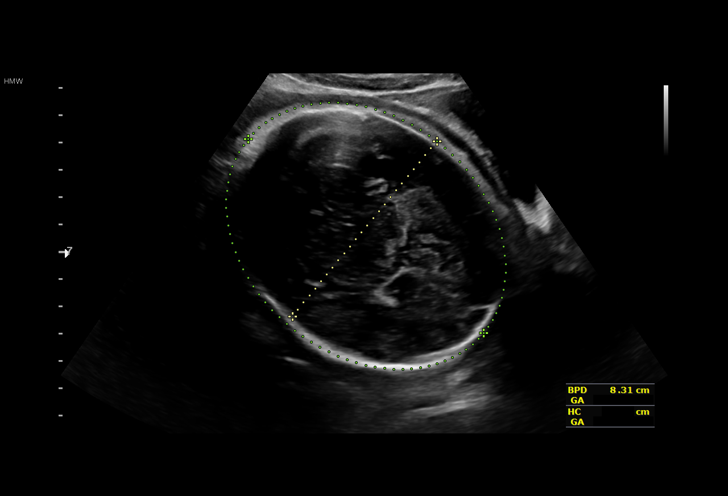
[im 5/39]
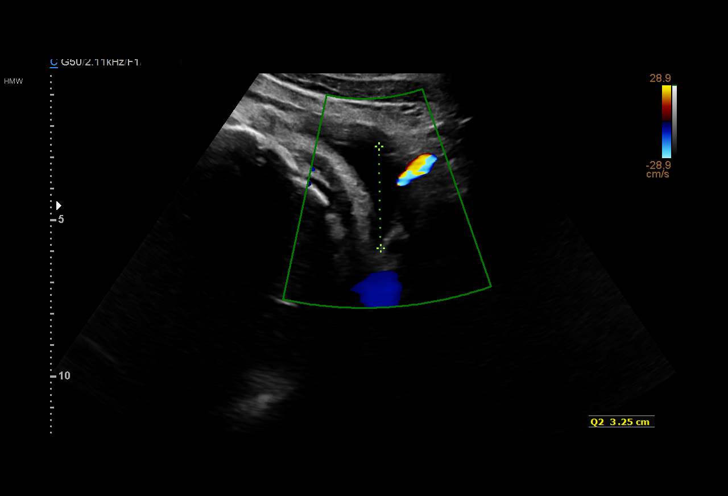
[im 8/39]
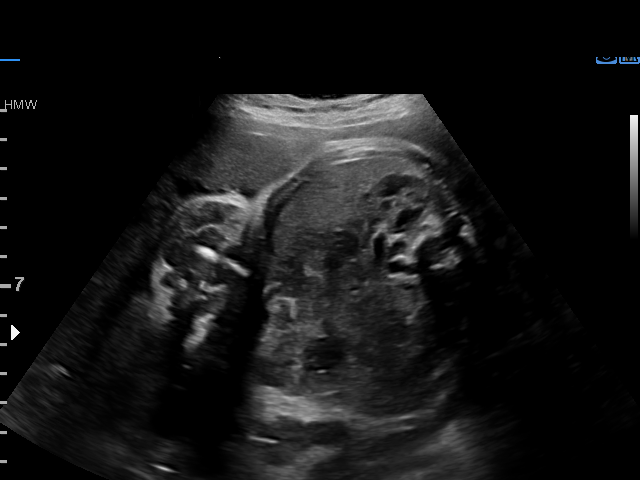
[im 10/39]
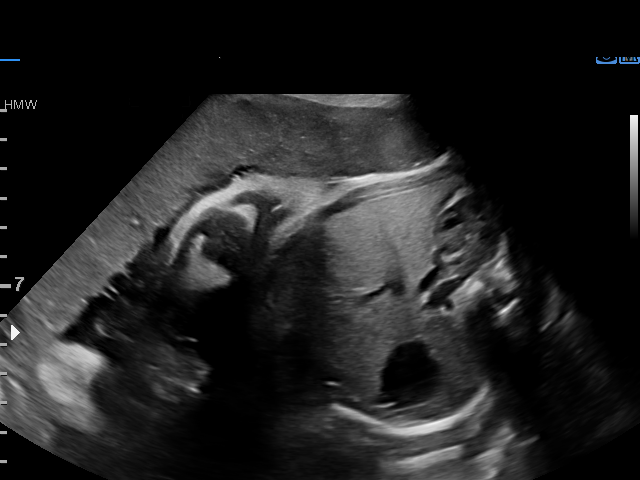
[im 13/39]
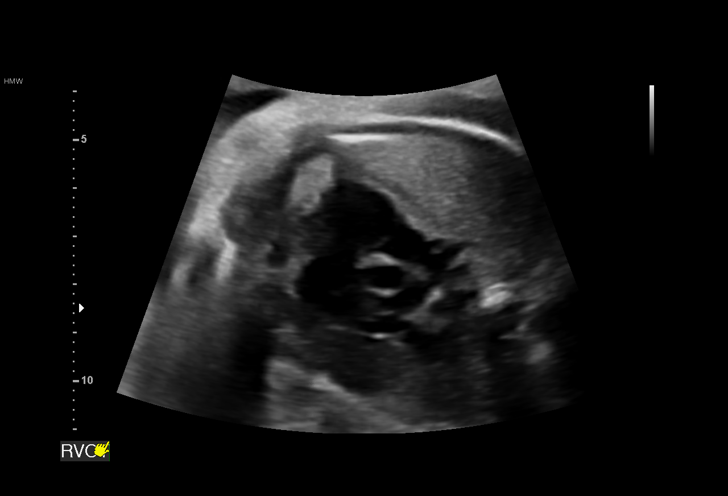
[im 16/39]
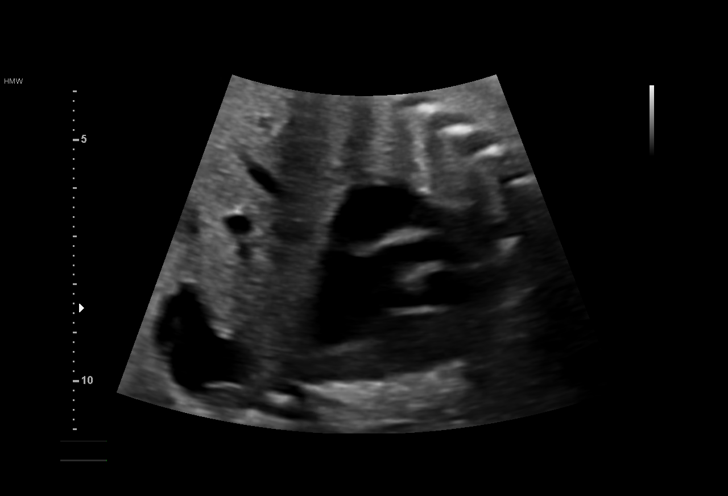
[im 19/39]
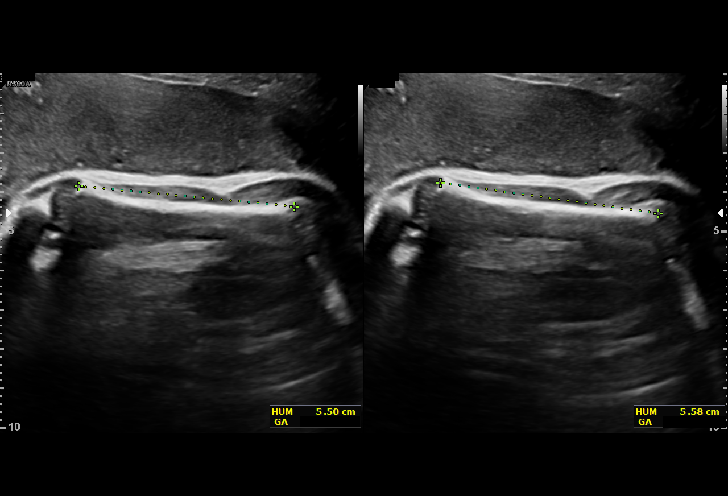
[im 22/39]
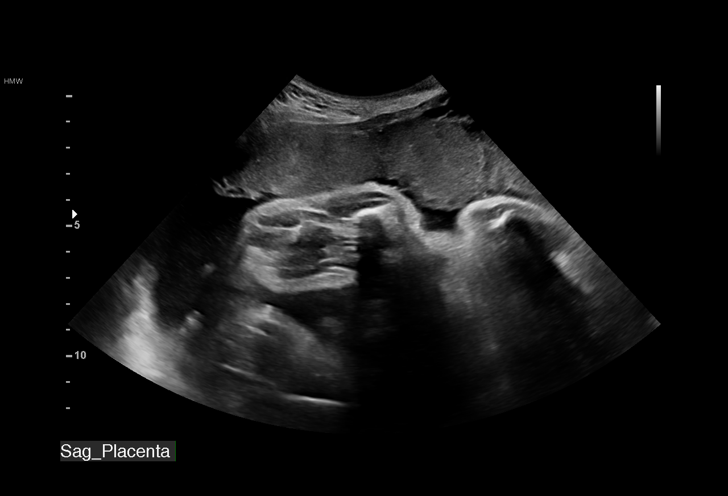
[im 24/39]
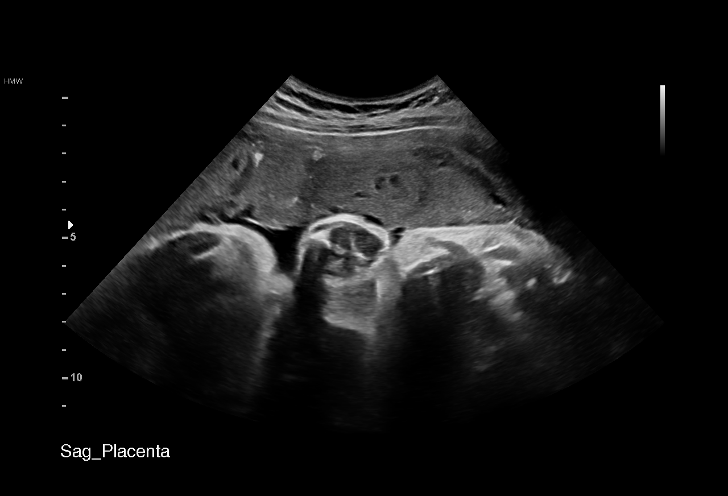
[im 27/39]
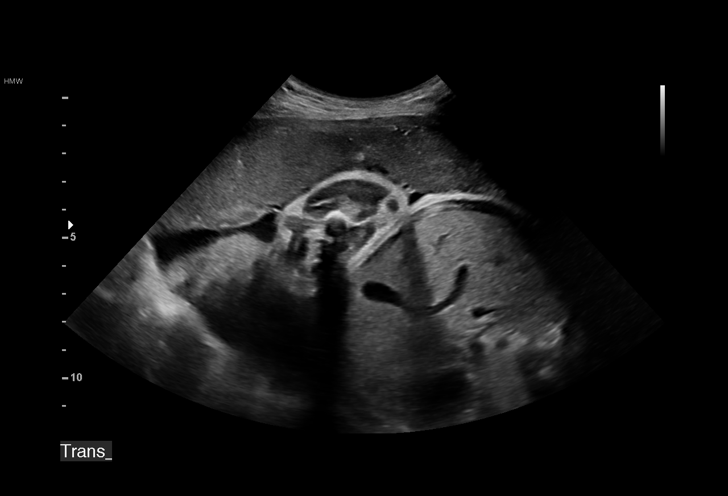
[im 30/39]
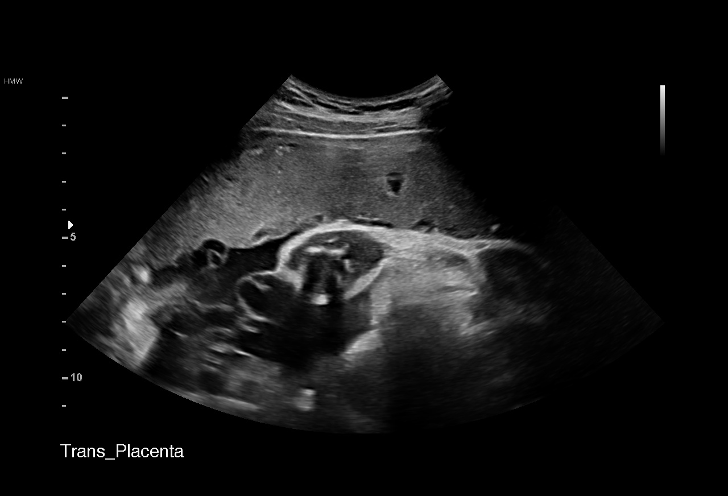
[im 33/39]
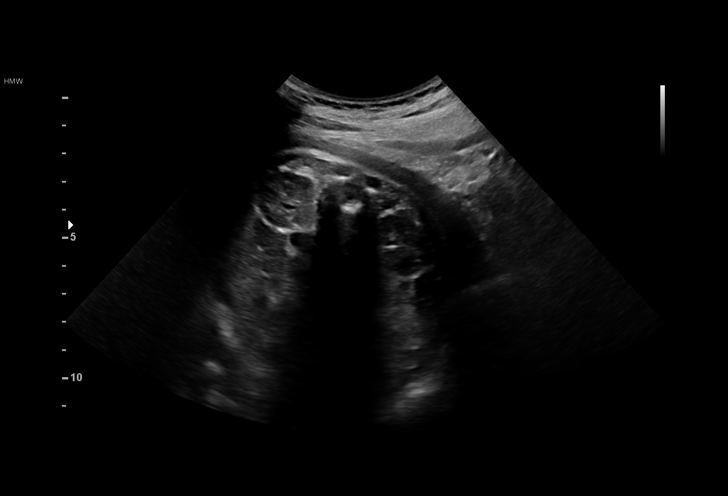
[im 36/39]
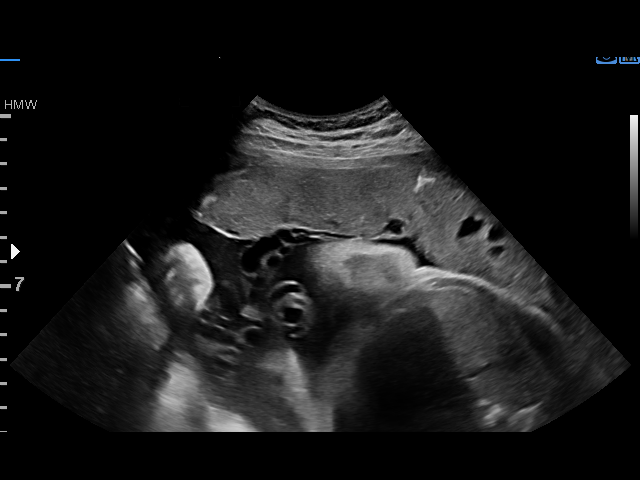
[im 39/39]
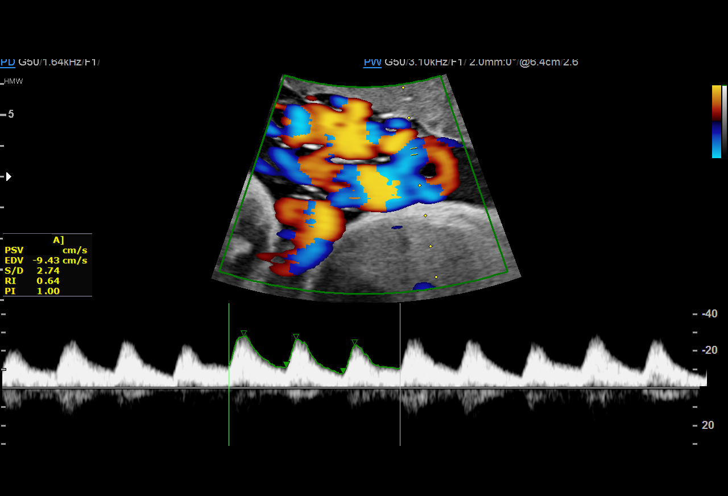

[14 of 28 positions shown; findings below may reference images not displayed]

Road [HOSPITAL]

Indications

37 weeks gestation of pregnancy
Asthma                                         OVV.PV j00.434
Late to prenatal care, third trimester
Uterine size-date discrepancy, third trimester
Maternal care for known or suspected poor
fetal growth, third trimester, not applicable or
unspecified
OB History

Blood Type:            Height:  5'2"   Weight (lb):  128       BMI:
Gravidity:    1
Fetal Evaluation

Num Of Fetuses:     1
Fetal Heart         133
Rate(bpm):
Cardiac Activity:   Observed
Presentation:       Cephalic
Placenta:           Anterior, above cervical os
P. Cord Insertion:  Previously Visualized

Amniotic Fluid
AFI FV:      Subjectively within normal limits

AFI Sum(cm)     %Tile       Largest Pocket(cm)
12.76           45
RUQ(cm)       RLQ(cm)       LUQ(cm)        LLQ(cm)
3.95
Biophysical Evaluation

Amniotic F.V:   Within normal limits       F. Tone:        Observed
F. Movement:    Observed                   Score:          [DATE]
F. Breathing:   Observed
Biometry

BPD:        85  mm     G. Age:  34w 1d          4  %    CI:        73.26   %    70 - 86
FL/HC:      19.4   %    20.8 -
HC:      315.6  mm     G. Age:  35w 3d          3  %    HC/AC:      1.02        0.92 -
AC:      308.8  mm     G. Age:  34w 5d          9  %    FL/BPD:     71.9   %    71 - 87
FL:       61.1  mm     G. Age:  31w 5d        < 3  %    FL/AC:      19.8   %    20 - 24
HUM:      55.4  mm     G. Age:  32w 2d        < 5  %

Est. FW:    8588  gm      5 lb 2 oz   < 10  %
Gestational Age

LMP:           38w 6d        Date:  09/03/16                 EDD:   06/10/17
U/S Today:     34w 0d                                        EDD:   07/14/17
Best:          37w 1d     Det. By:  U/S  (02/18/17)          EDD:   06/22/17
Anatomy

Cranium:               Appears normal         Aortic Arch:            Previously seen
Cavum:                 Previously seen        Ductal Arch:            Previously seen
Ventricles:            Previously seen        Diaphragm:              Previously seen
Choroid Plexus:        Previously seen        Stomach:                Appears normal, left
sided
Cerebellum:            Previously seen        Abdomen:                Previously seen
Posterior Fossa:       Previously seen        Abdominal Wall:         Previously seen
Nuchal Fold:           Not applicable (>20    Cord Vessels:           Previously seen
wks GA)
Face:                  Orbits and profile     Kidneys:                Appear normal
previously seen
Lips:                  Previously seen        Bladder:                Appears normal
Thoracic:              Appears normal         Spine:                  Previously seen
Heart:                 Previously seen        Upper Extremities:      Previously seen
RVOT:                  Appears normal         Lower Extremities:      Previously seen
LVOT:                  Appears normal

Other:  Fetus appears to be a female. Heels and LT 5th digit previously
visualized. Technically difficult due to fetal position.
Doppler - Fetal Vessels

Umbilical Artery
S/D     %tile     RI              PI              PSV    ADFV    RDFV
(cm/s)
3.26       92   0.69             1.19              35.1       N       N

Cervix Uterus Adnexa

Cervix
Not visualized (advanced GA >38wks)
Uterus
No abnormality visualized.

Left Ovary
No adnexal mass visualized.

Right Ovary
No adnexal mass visualized.

Cul De Sac:   No free fluid seen.

Adnexa:       No abnormality visualized.
Impression

SIUP at 37+1 weeks
Cephalic presentation
Normal interval anatomy; anatomic survey complete
Normal amniotic fluid volume
EFW < 10th %tile
UA dopplers were in the high normal range for this GA
BPP [DATE]
Recommendations

BPP and UA dopplers in one week
Deliver at 39+0 weeks

## 2018-06-20 ENCOUNTER — Ambulatory Visit: Payer: BLUE CROSS/BLUE SHIELD | Admitting: Family Medicine

## 2018-06-20 ENCOUNTER — Encounter: Payer: Self-pay | Admitting: Family Medicine

## 2018-06-20 VITALS — BP 122/80 | HR 71 | Temp 98.2°F | Resp 16 | Wt 117.8 lb

## 2018-06-20 DIAGNOSIS — R109 Unspecified abdominal pain: Secondary | ICD-10-CM | POA: Diagnosis not present

## 2018-06-20 LAB — POCT URINALYSIS DIP (PROADVANTAGE DEVICE)
Bilirubin, UA: NEGATIVE
Blood, UA: NEGATIVE
Glucose, UA: NEGATIVE mg/dL
Ketones, POC UA: NEGATIVE mg/dL
Leukocytes, UA: NEGATIVE
Nitrite, UA: NEGATIVE
Protein Ur, POC: NEGATIVE mg/dL
Specific Gravity, Urine: 1.01
Urobilinogen, Ur: NEGATIVE
pH, UA: 7.5 (ref 5.0–8.0)

## 2018-06-20 NOTE — Progress Notes (Signed)
   Subjective:    Patient ID: Kendra Francis, female    DOB: Mar 31, 1996, 22 y.o.   MRN: [id number]  HPI Chief Complaint  Patient presents with  . side pain    side pain- off and on for 2 months. knot on sternum that will come and go   She is a 22 year old breastfeeding female who is here with complaints of intermittent left side pain for the past 2 weeks. Last episode was over the weekend. Denies injury or history of the same. No pain today. Pain is described as sharp and stabbing when present. Pain comes on at random times. Questions some radiation to left lower abdomen. Denies any associated or aggravating symptoms when pain is present. Pain is not relieved with urination or movement nor is pain aggravated by movement.  Reports normal bowel movements. No nausea, vomiting or abdominal pain.   Denies fever, chills, dizziness, chest pain, palpitations, urinary symptoms, vaginal discharge.   No rash or skin changes.    States she has noticed a knot or bulge in her upper abdominal wall at times but states it is not present today.   Reviewed allergies, medications, past medical, surgical, family, and social history.   Review of Systems Pertinent positives and negatives in the history of present illness.     Objective:   Physical Exam BP 122/80   Pulse 71   Temp 98.2 F (36.8 C) (Oral)   Resp 16   Wt 117 lb 12.8 oz (53.4 kg)   SpO2 98%   BMI 21.55 kg/m   Alert and oriented and in no distress.  Pharyngeal area is normal. Neck is supple without adenopathy or thyromegaly. Cardiac exam shows a regular sinus rhythm without murmurs or gallops. Lungs are clear to auscultation. Abdomen is soft, non distended, normal BS, non tender, no rebound or guarding, no palpable masses or hernias. Back with normal sensation and motion, full ROM without pain. Extremities without edema, intact distal pulses. Skin is warm and dry, no pallor or rash or bruising.       Assessment & Plan:  Left flank pain  - Plan: POCT Urinalysis DIP (Proadvantage Device)  Urinalysis dipstick: negative  Pleasant 22 year old female who is here with her significant other and her 22 year old.  Discussed possible etiologies for left flank pain that is intermittent including MSK or kidney stone. She is asymptomatic today. Exam is unremarkable. Advised her to call or return if pain returns or if any new symptoms arise.  Discussed that the intermittent bulge she has been noticing is not present today and no evidence of a hernia. She will continue to monitor this as well.

## 2018-08-02 DIAGNOSIS — R51 Headache: Secondary | ICD-10-CM | POA: Diagnosis not present

## 2018-11-08 ENCOUNTER — Telehealth: Payer: Self-pay | Admitting: Family Medicine

## 2018-11-08 ENCOUNTER — Encounter: Payer: Self-pay | Admitting: Family Medicine

## 2018-11-08 DIAGNOSIS — J4520 Mild intermittent asthma, uncomplicated: Secondary | ICD-10-CM

## 2018-11-08 MED ORDER — ALBUTEROL SULFATE HFA 108 (90 BASE) MCG/ACT IN AERS: 2.0000 | INHALATION_SPRAY | Freq: Four times a day (QID) | RESPIRATORY_TRACT | 0 refills | Status: DC | PRN

## 2018-11-08 NOTE — Telephone Encounter (Signed)
Pt called and states that her albuterol inhaler has expired. She is not having issues with asthma. She just wants to have one on hand due to issues with convid19. Pt uses [facility] and can be reached at [phone].

## 2018-11-08 NOTE — Telephone Encounter (Signed)
Pt does have a history of asthma. She was out of town a week and half ago and had to use it thinking her allergies flared up

## 2018-11-08 NOTE — Telephone Encounter (Signed)
Ok to refill inhaler. She does have a history of asthma and has not had it refilled since 2017 as far as I can tell. Please find out when she last used it. What are her asthma triggers?

## 2018-11-25 ENCOUNTER — Other Ambulatory Visit: Payer: Self-pay | Admitting: Family Medicine

## 2018-11-25 ENCOUNTER — Encounter: Payer: Self-pay | Admitting: Family Medicine

## 2018-11-25 DIAGNOSIS — J4520 Mild intermittent asthma, uncomplicated: Secondary | ICD-10-CM

## 2018-11-28 NOTE — Telephone Encounter (Signed)
Is this okay to refill? 

## 2018-11-28 NOTE — Telephone Encounter (Signed)
This was refilled 2 weeks ago. Please find out why she is requesting another refill. She should not need it.

## 2018-11-28 NOTE — Telephone Encounter (Signed)
Pt does not need this med

## 2019-03-07 ENCOUNTER — Other Ambulatory Visit: Payer: Self-pay

## 2019-03-07 ENCOUNTER — Inpatient Hospital Stay (HOSPITAL_COMMUNITY): Payer: BC Managed Care – PPO

## 2019-03-07 ENCOUNTER — Ambulatory Visit: Payer: BLUE CROSS/BLUE SHIELD | Admitting: Medical

## 2019-03-07 ENCOUNTER — Inpatient Hospital Stay (HOSPITAL_COMMUNITY)
Admission: EM | Admit: 2019-03-07 | Discharge: 2019-03-07 | Disposition: A | Discharge status: Home / Self Care | Payer: BC Managed Care – PPO | Attending: Obstetrics and Gynecology | Admitting: Obstetrics and Gynecology

## 2019-03-07 ENCOUNTER — Encounter (HOSPITAL_COMMUNITY): Payer: Self-pay

## 2019-03-07 DIAGNOSIS — R109 Unspecified abdominal pain: Secondary | ICD-10-CM | POA: Diagnosis not present

## 2019-03-07 DIAGNOSIS — O99511 Diseases of the respiratory system complicating pregnancy, first trimester: Secondary | ICD-10-CM | POA: Diagnosis not present

## 2019-03-07 DIAGNOSIS — J45909 Unspecified asthma, uncomplicated: Secondary | ICD-10-CM | POA: Insufficient documentation

## 2019-03-07 DIAGNOSIS — O26891 Other specified pregnancy related conditions, first trimester: Secondary | ICD-10-CM | POA: Diagnosis not present

## 2019-03-07 DIAGNOSIS — Z3A12 12 weeks gestation of pregnancy: Secondary | ICD-10-CM | POA: Insufficient documentation

## 2019-03-07 DIAGNOSIS — Z79899 Other long term (current) drug therapy: Secondary | ICD-10-CM | POA: Diagnosis not present

## 2019-03-07 DIAGNOSIS — O209 Hemorrhage in early pregnancy, unspecified: Secondary | ICD-10-CM | POA: Diagnosis not present

## 2019-03-07 DIAGNOSIS — O3680X0 Pregnancy with inconclusive fetal viability, not applicable or unspecified: Secondary | ICD-10-CM | POA: Diagnosis not present

## 2019-03-07 DIAGNOSIS — O200 Threatened abortion: Secondary | ICD-10-CM | POA: Diagnosis not present

## 2019-03-07 DIAGNOSIS — O4690 Antepartum hemorrhage, unspecified, unspecified trimester: Secondary | ICD-10-CM

## 2019-03-07 DIAGNOSIS — Z6790 Unspecified blood type, Rh positive: Secondary | ICD-10-CM

## 2019-03-07 DIAGNOSIS — O4691 Antepartum hemorrhage, unspecified, first trimester: Secondary | ICD-10-CM | POA: Diagnosis not present

## 2019-03-07 LAB — CBC
HCT: 35.7 % — ABNORMAL LOW (ref 36.0–46.0)
Hemoglobin: 12.1 g/dL (ref 12.0–15.0)
MCH: 33.2 pg (ref 26.0–34.0)
MCHC: 33.9 g/dL (ref 30.0–36.0)
MCV: 97.8 fL (ref 80.0–100.0)
Platelets: 295 10*3/uL (ref 150–400)
RBC: 3.65 MIL/uL — ABNORMAL LOW (ref 3.87–5.11)
RDW: 11.5 % (ref 11.5–15.5)
WBC: 6 10*3/uL (ref 4.0–10.5)
nRBC: 0 % (ref 0.0–0.2)

## 2019-03-07 LAB — WET PREP, GENITAL
Clue Cells Wet Prep HPF POC: NONE SEEN
Sperm: NONE SEEN
Trich, Wet Prep: NONE SEEN
WBC, Wet Prep HPF POC: NONE SEEN
Yeast Wet Prep HPF POC: NONE SEEN

## 2019-03-07 LAB — POCT PREGNANCY, URINE: Preg Test, Ur: POSITIVE — AB

## 2019-03-07 LAB — HCG, QUANTITATIVE, PREGNANCY: hCG, Beta Chain, Quant, S: 3274 m[IU]/mL — ABNORMAL HIGH (ref <5)

## 2019-03-07 NOTE — Discharge Instructions (Signed)
Vaginal Bleeding During Pregnancy, First Trimester ° °A small amount of bleeding (spotting) from the vagina is common during early pregnancy. Sometimes the bleeding is normal and does not cause problems. At other times, though, bleeding may be a sign of something serious. Tell your doctor about any bleeding from your vagina right away. °Follow these instructions at home: °Activity °· Follow your doctor's instructions about how active you can be. °· If needed, make plans for someone to help with your normal activities. °· Do not have sex or orgasms until your doctor says that this is safe. °General instructions °· Take over-the-counter and prescription medicines only as told by your doctor. °· Watch your condition for any changes. °· Write down: °? The number of pads you use each day. °? How often you change pads. °? How soaked (saturated) your pads are. °· Do not use tampons. °· Do not douche. °· If you pass any tissue from your vagina, save it to show to your doctor. °· Keep all follow-up visits as told by your doctor. This is important. °Contact a doctor if: °· You have vaginal bleeding at any time while you are pregnant. °· You have cramps. °· You have a fever. °Get help right away if: °· You have very bad cramps in your back or belly (abdomen). °· You pass large clots or a lot of tissue from your vagina. °· Your bleeding gets worse. °· You feel light-headed. °· You feel weak. °· You pass out (faint). °· You have chills. °· You are leaking fluid from your vagina. °· You have a gush of fluid from your vagina. °Summary °· Sometimes vaginal bleeding during pregnancy is normal and does not cause problems. At other times, bleeding may be a sign of something serious. °· Tell your doctor about any bleeding from your vagina right away. °· Follow your doctor's instructions about how active you can be. You may need someone to help you with your normal activities. °This information is not intended to replace advice given to  you by your health care provider. Make sure you discuss any questions you have with your health care provider. °Document Released: 12/18/2013 Document Revised: 11/22/2018 Document Reviewed: 11/04/2016 °Elsevier Patient Education © 2020 [employer] ° °

## 2019-03-07 NOTE — MAU Note (Signed)
Pt reports to MAU c/o lower abdominal cramping that started today about 2130. Pt reports taking two tylenol and the pain is now at a 2. Pt reports having vaginal bleeding that has made her bleed thru 3 panty liners. Around 2200 she passed a clot. LMP 12/13/18. Pt has not started care yet.

## 2019-03-07 NOTE — MAU Provider Note (Signed)
History     CSN: [id number]  Arrival date and time: 03/07/19 3536   First Provider Initiated Contact with Patient 03/07/19 0159      Chief Complaint  Patient presents with  . Abdominal Pain  . Vaginal Bleeding  . Possible Pregnancy   23 y.o. G2P1001 @12 .0 wks presenting with VB. Reports bleeding started 2 days ago. She soaked through a pantyliner. Started having cramping last night and passes a clot. Reports passing another clot after arrival to MAU. Pain is minimal now.    OB History    Gravida  2   Para  1   Term  1   Preterm  0   AB  0   Living  1     SAB  0   TAB  0   Ectopic  0   Multiple      Live Births  1           Past Medical History:  Diagnosis Date  . Asthma   . Seasonal allergies     Past Surgical History:  Procedure Laterality Date  . NO PAST SURGERIES      Family History  Problem Relation Age of Onset  . Migraines Mother     Social History   Tobacco Use  . Smoking status: Never Smoker  . Smokeless tobacco: Never Used  Substance Use Topics  . Alcohol use: No  . Drug use: No    Allergies: No Known Allergies  No medications prior to admission.    Review of Systems  Gastrointestinal: Positive for abdominal pain.  Genitourinary: Positive for vaginal bleeding.   Physical Exam   Blood pressure 125/79, pulse 78, temperature 98.5 F (36.9 C), temperature source Oral, resp. rate 17, last menstrual period 12/13/2018, unknown if currently breastfeeding.  Physical Exam  Nursing note and vitals reviewed. Constitutional: She is oriented to person, place, and time. She appears well-developed and well-nourished. No distress.  HENT:  Head: Normocephalic and atraumatic.  Neck: Normal range of motion.  Cardiovascular: Normal rate.  Respiratory: Effort normal.  GI: Soft. She exhibits no distension and no mass. There is no abdominal tenderness. There is no rebound and no guarding.  Genitourinary:    Genitourinary Comments:  External: no lesions or erythema Vagina: rugated, pink, moist, small amt bloody discharge, cleared with 1 fox swab Uterus: + enlarged, anteverted, non tender, no CMT Adnexae: no masses, no tenderness left, no tenderness right Cervix closed    Musculoskeletal: Normal range of motion.  Neurological: She is alert and oriented to person, place, and time.  Skin: Skin is warm and dry.  Psychiatric: She has a normal mood and affect.   Results for orders placed or performed during the hospital encounter of 03/07/19 (from the past 24 hour(s))  Pregnancy, urine POC     Status: Abnormal   Collection Time: 03/07/19  1:27 AM  Result Value Ref Range   Preg Test, Ur POSITIVE (A) NEGATIVE  CBC     Status: Abnormal   Collection Time: 03/07/19  1:35 AM  Result Value Ref Range   WBC 6.0 4.0 - 10.5 K/uL   RBC 3.65 (L) 3.87 - 5.11 MIL/uL   Hemoglobin 12.1 12.0 - 15.0 g/dL   HCT 35.7 (L) 36.0 - 46.0 %   MCV 97.8 80.0 - 100.0 fL   MCH 33.2 26.0 - 34.0 pg   MCHC 33.9 30.0 - 36.0 g/dL   RDW 11.5 11.5 - 15.5 %   Platelets 295 150 -  400 K/uL   nRBC 0.0 0.0 - 0.2 %  hCG, quantitative, pregnancy     Status: Abnormal   Collection Time: 03/07/19  1:35 AM  Result Value Ref Range   hCG, Beta Chain, Quant, S 3,274 (H) <5 mIU/mL  Wet prep, genital     Status: None   Collection Time: 03/07/19  2:07 AM  Result Value Ref Range   Yeast Wet Prep HPF POC NONE SEEN NONE SEEN   Trich, Wet Prep NONE SEEN NONE SEEN   Clue Cells Wet Prep HPF POC NONE SEEN NONE SEEN   WBC, Wet Prep HPF POC NONE SEEN NONE SEEN   Sperm NONE SEEN    [place] Ob Less Than 14 Weeks With Ob Transvaginal  Result Date: 03/07/2019 CLINICAL DATA:  [age] year old pregnant female with vaginal bleeding. LMP: 12/13/2018 corresponding to an estimated gestational age of [redacted] weeks, 0 days. EXAM: OBSTETRIC <14 WK US AND TRANSVAGINAL OB US TECHNIQUE: Both transabdominal and transvaginal ultrasound examinations were performed for complete evaluation of the  gestation as well as the maternal uterus, adnexal regions, and pelvic cul-de-sac. Transvaginal technique was performed to assess early pregnancy. COMPARISON:  None. FINDINGS: The uterus is anteverted and appears unremarkable. The endometrium is unremarkable and measures approximately 7 mm in thickness. No intrauterine pregnancy identified. The ovaries are unremarkable. The right ovary measures 3.8 x 2.3 x 2.1 cm and the left ovary measures 2.3 x 1.4 x 1.8 cm. There is a small corpus luteum in the right ovary. No significant free fluid noted in the pelvis. IMPRESSION: No intrauterine pregnancy identified and no adnexal masses noted. Findings consistent with pregnancy of unknown location and differential diagnosis includes early intrauterine pregnancy, recent spontaneous abortion, or occult ectopic pregnancy. Clinical correlation and follow-up with serial HCG levels and repeat ultrasound in 7-11 days, or earlier if clinically indicated, recommended. Electronically Signed   By: Elgie CollardArash  Radparvar M.D.   On: 03/07/2019 03:13   MAU Course  Procedures Orders Placed This Encounter  Procedures  . Wet prep, genital    Standing Status:   Standing    Number of Occurrences:   1    Order Specific Question:   Patient immune status    Answer:   Normal  . US OB LESS THAN 14 WEEKS WITH OB TRANSVAGINAL    Standing Status:   Standing    Number of Occurrences:   1    Order Specific Question:   Symptom/Reason for Exam    Answer:   Vaginal bleeding in pregnancy [[id number]]  . CBC    Standing Status:   Standing    Number of Occurrences:   1  . hCG, quantitative, pregnancy    Standing Status:   Standing    Number of Occurrences:   1  . Pregnancy, urine POC    Standing Status:   Standing    Number of Occurrences:   1  . Discharge patient    Order Specific Question:   Discharge disposition    Answer:   01-Home or Self Care [1]    Order Specific Question:   Discharge patient date    Answer:   03/07/2019   MDM Labs  and US ordered and reviewed. Tissue passed in MAU appears to be gestational sac, will send to path. No IUP on US, suspect recent SAB but cannot r/o early pregnancy or ectopic pregnancy since she never had US to confirm. Will recheck qhcg in 2 days. Stable for discharge home.   Assessment and Plan  1. Pregnancy, location unknown   2. Vaginal bleeding in pregnancy   3. Blood type, Rh positive    [facility] message sent Return precautions  Allergies as of 03/07/2019   No Known Allergies     Medication List    STOP taking these medications   ALPRAZolam 0.25 MG tablet Commonly known as: XANAX   ibuprofen 200 MG tablet Commonly known as: ADVIL   ibuprofen 600 MG tablet Commonly known as: ADVIL   senna-docusate 8.6-50 MG tablet Commonly known as: Senokot-S   Vitamin D (Ergocalciferol) 1.25 MG (50000 UT) Caps capsule Commonly known as: DRISDOL     TAKE these medications   acetaminophen 500 MG tablet Commonly known as: TYLENOL Take 500 mg by mouth every 6 (six) hours as needed.   albuterol 108 (90 Base) MCG/ACT inhaler Commonly known as: VENTOLIN HFA Inhale 2 puffs into the lungs every 6 (six) hours as needed. Wheezing or shortness of breath   ALLEGRA PO Take 1 tablet by mouth daily.   aspirin-acetaminophen-caffeine 250-250-65 MG tablet Commonly known as: EXCEDRIN MIGRAINE Take 1 tablet by mouth every 6 (six) hours as needed for headache.   Prenate Pixie 10-0.6-0.4-200 MG Caps Take 1 tablet by mouth daily.   triamcinolone cream 0.1 % Commonly known as: KENALOG Apply 1 application topically 2 (two) times daily.      Donette LarryMelanie Gilverto Dileonardo, CNM 03/07/2019, 7:42 AM

## 2019-03-08 LAB — GC/CHLAMYDIA PROBE AMP (~~LOC~~) NOT AT ARMC
Chlamydia: NEGATIVE
Neisseria Gonorrhea: NEGATIVE

## 2019-03-09 ENCOUNTER — Other Ambulatory Visit: Payer: Self-pay

## 2019-03-09 ENCOUNTER — Other Ambulatory Visit: Payer: Medicaid Other

## 2019-03-09 DIAGNOSIS — O4690 Antepartum hemorrhage, unspecified, unspecified trimester: Secondary | ICD-10-CM

## 2019-03-10 LAB — BETA HCG QUANT (REF LAB): hCG Quant: 460 m[IU]/mL

## 2019-03-16 ENCOUNTER — Other Ambulatory Visit: Payer: Medicaid Other

## 2019-03-16 ENCOUNTER — Other Ambulatory Visit: Payer: Self-pay

## 2019-03-16 DIAGNOSIS — O039 Complete or unspecified spontaneous abortion without complication: Secondary | ICD-10-CM | POA: Diagnosis not present

## 2019-03-17 LAB — BETA HCG QUANT (REF LAB): hCG Quant: 56 m[IU]/mL

## 2019-03-23 ENCOUNTER — Telehealth: Payer: Self-pay

## 2019-03-23 ENCOUNTER — Other Ambulatory Visit: Payer: BC Managed Care – PPO

## 2019-03-23 ENCOUNTER — Other Ambulatory Visit: Payer: Self-pay

## 2019-03-23 DIAGNOSIS — O039 Complete or unspecified spontaneous abortion without complication: Secondary | ICD-10-CM

## 2019-03-23 NOTE — Telephone Encounter (Addendum)
-----   Message from Julianne Handler, [place] sent at 03/17/2019  8:09 AM EDT ----- Needs weekly qhcg until negative

## 2019-03-23 NOTE — Progress Notes (Unsigned)
Pt in office lab needs order for quant

## 2019-03-24 LAB — BETA HCG QUANT (REF LAB): hCG Quant: 14 m[IU]/mL

## 2019-03-31 ENCOUNTER — Encounter: Payer: Self-pay | Admitting: Family Medicine

## 2019-03-31 ENCOUNTER — Other Ambulatory Visit: Payer: Self-pay

## 2019-03-31 ENCOUNTER — Ambulatory Visit: Payer: BC Managed Care – PPO | Admitting: Family Medicine

## 2019-03-31 VITALS — BP 130/70 | HR 69 | Temp 98.7°F | Ht 62.0 in | Wt 123.2 lb

## 2019-03-31 DIAGNOSIS — R319 Hematuria, unspecified: Secondary | ICD-10-CM | POA: Diagnosis not present

## 2019-03-31 DIAGNOSIS — R350 Frequency of micturition: Secondary | ICD-10-CM | POA: Diagnosis not present

## 2019-03-31 DIAGNOSIS — R300 Dysuria: Secondary | ICD-10-CM | POA: Diagnosis not present

## 2019-03-31 DIAGNOSIS — N3001 Acute cystitis with hematuria: Secondary | ICD-10-CM | POA: Diagnosis not present

## 2019-03-31 LAB — POCT URINALYSIS DIP (PROADVANTAGE DEVICE)
Bilirubin, UA: NEGATIVE
Glucose, UA: NEGATIVE mg/dL
Ketones, POC UA: NEGATIVE mg/dL
Leukocytes, UA: NEGATIVE
Nitrite, UA: NEGATIVE
Protein Ur, POC: NEGATIVE mg/dL
Specific Gravity, Urine: 1.02
Urobilinogen, Ur: NEGATIVE
pH, UA: 6.5 (ref 5.0–8.0)

## 2019-03-31 MED ORDER — SULFAMETHOXAZOLE-TRIMETHOPRIM 800-160 MG PO TABS: 1.0000 | ORAL_TABLET | Freq: Two times a day (BID) | ORAL | 0 refills | Status: DC

## 2019-03-31 NOTE — Patient Instructions (Signed)
Take the antibiotic as prescribed.  Drink plenty of water to make sure you are well-hydrated.  You can take over-the-counter AZO today if needed for pain.  Follow-up if your symptoms are worsening or if you are not back to baseline after completing the antibiotic.  Return in 2 weeks for a lab visit to recheck your urine sample to make sure it has cleared.

## 2019-03-31 NOTE — Progress Notes (Signed)
   Subjective:    Patient ID: Kendra Francis, female    DOB: August 04, 1996, 23 y.o.   MRN: [id number]  HPI Chief Complaint  Patient presents with  . Urinary Tract Infection    frequent urination with burning and pressure x1 day    Complains of a 2 day history dysuria, urinary frequency and urgency.   Denies fever, chills, body aches, abdominal pain, back pain, N/V/D. No vaginal discharge.   Last UTI 2016. No hx of recurrent UTI.   She took one Advil last night. Drinking plenty of water.   States she had a miscarriage 3 weeks ago. July 20th.   Reviewed allergies, medications, past medical, surgical, family, and social history.    Review of Systems Pertinent positives and negatives in the history of present illness.     Objective:   Physical Exam BP 130/70   Pulse 69   Temp 98.7 F (37.1 C)   Ht 5\' 2"  (1.575 m)   Wt 123 lb 3.2 oz (55.9 kg)   LMP 12/13/2018 (Exact Date)   SpO2 99%   Breastfeeding No Comment: miscarriage 03/06/19  BMI 22.53 kg/m   Alert and in no distress.  Cardiac exam shows a regular sinus rhythm without murmurs or gallops. Lungs are clear to auscultation. Abdomen soft, non distended, normal BS, non tender. No CVAT. GU exam declined.       Assessment & Plan:  Acute cystitis with hematuria - Plan: sulfamethoxazole-trimethoprim (BACTRIM DS) 800-160 MG tablet, POCT Urinalysis DIP (Proadvantage Device), Urine Culture, POCT Urinalysis DIP (Proadvantage Device). start the antibiotic, increase water intake. May take AZO today if needed for pain. Follow up if worsening or not back to baseline after completing the antibiotic.   Urinary frequency - Plan: POCT Urinalysis DIP (Proadvantage Device), Urine Culture. See above note  Dysuria - Plan: POCT Urinalysis DIP (Proadvantage Device), Urine Culture, see above note.   Hematuria, unspecified type - Plan: POCT Urinalysis DIP (Proadvantage Device), follow up in 2 weeks for recheck UA. She may be starting her  period since having miscarriage 3 weeks ago.

## 2019-04-02 LAB — URINE CULTURE

## 2019-04-10 NOTE — Telephone Encounter (Signed)
Pt was scheduled on 03/23/19 for beta draw.

## 2019-04-13 ENCOUNTER — Telehealth: Payer: Self-pay | Admitting: Family Medicine

## 2019-04-13 NOTE — Telephone Encounter (Signed)
Patient has been informed.

## 2019-04-13 NOTE — Telephone Encounter (Signed)
Pt is on lab visit for tomorrow for repeat urine. She called and wanted to know if her urine can be checked for pregnancy also.

## 2019-04-13 NOTE — Telephone Encounter (Signed)
Ok to check urine pregnancy but let her know that I will not be in the office in case she has any questions about her result.

## 2019-04-14 ENCOUNTER — Other Ambulatory Visit: Payer: Self-pay

## 2019-04-14 ENCOUNTER — Other Ambulatory Visit (INDEPENDENT_AMBULATORY_CARE_PROVIDER_SITE_OTHER): Payer: BC Managed Care – PPO

## 2019-04-14 DIAGNOSIS — R319 Hematuria, unspecified: Secondary | ICD-10-CM

## 2019-04-14 LAB — POCT URINALYSIS DIP (PROADVANTAGE DEVICE)
Bilirubin, UA: NEGATIVE
Blood, UA: NEGATIVE
Glucose, UA: NEGATIVE mg/dL
Ketones, POC UA: NEGATIVE mg/dL
Leukocytes, UA: NEGATIVE
Nitrite, UA: NEGATIVE
Protein Ur, POC: NEGATIVE mg/dL
Specific Gravity, Urine: 1.025
Urobilinogen, Ur: NEGATIVE
pH, UA: 6 (ref 5.0–8.0)

## 2019-04-14 LAB — POCT URINE PREGNANCY: Preg Test, Ur: POSITIVE — AB

## 2019-04-19 ENCOUNTER — Telehealth: Payer: Self-pay | Admitting: Family Medicine

## 2019-04-19 DIAGNOSIS — Z3400 Encounter for supervision of normal first pregnancy, unspecified trimester: Secondary | ICD-10-CM

## 2019-04-19 MED ORDER — PRENATE PIXIE 10-0.6-0.4-200 MG PO CAPS: 1.0000 | ORAL_CAPSULE | Freq: Every day | ORAL | 2 refills | Status: AC

## 2019-04-19 NOTE — Telephone Encounter (Signed)
Pt called and is requesting a a rx for Prenate pixie vitamins, pt would like it sent to the [facility], [address]

## 2019-04-19 NOTE — Telephone Encounter (Signed)
Ok to do this 

## 2019-04-19 NOTE — Telephone Encounter (Signed)
Sent in med

## 2019-05-30 ENCOUNTER — Other Ambulatory Visit: Payer: Self-pay | Admitting: Obstetrics and Gynecology

## 2019-05-30 ENCOUNTER — Ambulatory Visit (INDEPENDENT_AMBULATORY_CARE_PROVIDER_SITE_OTHER): Payer: BC Managed Care – PPO

## 2019-05-30 DIAGNOSIS — Z3480 Encounter for supervision of other normal pregnancy, unspecified trimester: Secondary | ICD-10-CM | POA: Insufficient documentation

## 2019-05-30 MED ORDER — BLOOD PRESSURE KIT DEVI: 1.0000 | 0 refills | Status: AC | PRN

## 2019-05-30 MED ORDER — VITAFOL GUMMIES 3.33-0.333-34.8 MG PO CHEW: 1.0000 | CHEWABLE_TABLET | Freq: Every day | ORAL | 5 refills | Status: DC

## 2019-05-30 NOTE — Addendum Note (Signed)
Addended by: [name] on: 05/30/2019 05:19 PM   Modules accepted: Orders

## 2019-05-30 NOTE — Progress Notes (Addendum)
  Virtual Visit via Telephone Note  I connected with Virgina Francis on 05/30/19 at  8:45 AM EDT by telephone and verified that I am speaking with the correct person using two identifiers.  Location: Patient: Kendra Francis  Provider: Hollice Gong, CMA    I discussed the limitations, risks, security and privacy concerns of performing an evaluation and management service by telephone and the availability of in person appointments. I also discussed with the patient that there may be a patient responsible charge related to this service. The patient expressed understanding and agreed to proceed.   History of Present Illness: PRENATAL INTAKE SUMMARY  Kendra Francis presents today New OB Nurse Interview.  OB History    Gravida  3   Para  1   Term  1   Preterm  0   AB  1   Living  1     SAB  0   TAB  0   Ectopic  0   Multiple      Live Births  1          I have reviewed the patient's medical, obstetrical, social, and family histories, medications, and available lab results.  SUBJECTIVE She has no unusual complaints   Observations/Objective: Initial nurse interview for history/labs (New OB)  EDD: 12/18/2019 (Confirmed at [place] per pt U/S 05/03/2019 107w2d) GA: [redacted]w[redacted]d GP: G3/P1 FHT: non face to face interview   GENERAL APPEARANCE: alert, oriented to person, place and time, non face to face interview   Assessment and Plan: Normal pregnancy Prenatal care at [place] labs and exam to be completed at provider visit Download Babyscripts BP cuff explained and ordered   Bring u/s results to [place]  PNV's ordered    Follow Up Instructions:   I discussed the assessment and treatment plan with the patient. The patient was provided an opportunity to ask questions and all were answered. The patient agreed with the plan and demonstrated an understanding of the instructions.   The patient was advised to call back or seek an in-person evaluation if  the symptoms worsen or if the condition fails to improve as anticipated.  I provided 20 minutes of non-face-to-face time during this encounter.   [name], CMA

## 2019-05-31 ENCOUNTER — Other Ambulatory Visit: Payer: Self-pay

## 2019-05-31 ENCOUNTER — Ambulatory Visit: Payer: BC Managed Care – PPO | Admitting: Family Medicine

## 2019-05-31 ENCOUNTER — Encounter: Payer: Self-pay | Admitting: Family Medicine

## 2019-05-31 VITALS — Wt 120.0 lb

## 2019-05-31 DIAGNOSIS — J029 Acute pharyngitis, unspecified: Secondary | ICD-10-CM | POA: Diagnosis not present

## 2019-05-31 DIAGNOSIS — Z3A11 11 weeks gestation of pregnancy: Secondary | ICD-10-CM

## 2019-05-31 NOTE — Progress Notes (Signed)
   Subjective:  Documentation for virtual audio and video telecommunications through [facility] encounter:  The patient was located in her car. 2 patient identifiers used.  The provider was located in the office. The patient did consent to this visit and is aware of possible charges through their insurance for this visit.  The other persons participating in this telemedicine service were none.    Patient ID: Kendra Francis, female    DOB: 07/25/1996, 23 y.o.   MRN: [id number]  HPI  Chief Complaint  Patient presents with  . sore throat    sore throat x 4days. not tried anything otc. [redacted] weeks pregnant   Complains of sore throat x 4 days on the left side mainly. States pain does not seem to be with swallowing but more with certain movements of her jaw including yawning and head turning. Denies history of recurrent strep throat. Denies any difficulty swallowing. No tongue or throat edema. Denies history of TMJ disorder. No ear pain, post nasal drainage or any other URI symptoms.  States it feels like a sore muscle.  Denies toothache or gum issue.   She is approximately [redacted] weeks pregnant.   Denies fever, chills, dizziness, chest pain, palpitations, shortness of breath, abdominal pain, N/V/D. No loss of taste or smell.     Review of Systems Pertinent positives and negatives in the history of present illness.     Objective:   Physical Exam Wt 120 lb (54.4 kg)   LMP 12/13/2018 (Exact Date)   Breastfeeding No   BMI 21.95 kg/m   Alert and oriented and in no acute distress. She is swallowing without pain, able to move her head in all directions without pain. Normal respirations.       Assessment & Plan:  Acute pharyngitis, unspecified etiology  [redacted] weeks gestation of pregnancy  Discussed that her symptoms sound more MSK and she may try Tylenol. She is avoiding NSAIDs due to pregnancy. Follow up if she develops any new or worsening symptoms.   Time spent on call was 14  minutes and in review of previous records 2 minutes total.  This virtual service is not related to other E/M service within previous 7 days.

## 2019-05-31 NOTE — Progress Notes (Signed)
Patient seen and assessed by nursing staff during this encounter. I have reviewed the chart and agree with the documentation and plan.  Shad Ledvina, MD 05/31/2019 8:14 AM    

## 2019-06-09 ENCOUNTER — Ambulatory Visit (INDEPENDENT_AMBULATORY_CARE_PROVIDER_SITE_OTHER): Payer: BC Managed Care – PPO | Admitting: Certified Nurse Midwife

## 2019-06-09 ENCOUNTER — Other Ambulatory Visit (HOSPITAL_COMMUNITY)
Admission: RE | Admit: 2019-06-09 | Discharge: 2019-06-09 | Disposition: A | Discharge status: Home / Self Care | Payer: BC Managed Care – PPO | Source: Ambulatory Visit | Attending: Certified Nurse Midwife | Admitting: Certified Nurse Midwife

## 2019-06-09 ENCOUNTER — Other Ambulatory Visit: Payer: Self-pay

## 2019-06-09 ENCOUNTER — Encounter: Payer: Self-pay | Admitting: Certified Nurse Midwife

## 2019-06-09 DIAGNOSIS — Z3A12 12 weeks gestation of pregnancy: Secondary | ICD-10-CM

## 2019-06-09 DIAGNOSIS — Z3480 Encounter for supervision of other normal pregnancy, unspecified trimester: Secondary | ICD-10-CM | POA: Insufficient documentation

## 2019-06-09 DIAGNOSIS — Z3481 Encounter for supervision of other normal pregnancy, first trimester: Secondary | ICD-10-CM

## 2019-06-09 DIAGNOSIS — Z315 Encounter for genetic counseling: Secondary | ICD-10-CM | POA: Diagnosis not present

## 2019-06-09 NOTE — Progress Notes (Signed)
History:   Kendra Francis is a 23 y.o. G3P1011 at 61w4dby early [place] [facility] being seen today for her first obstetrical visit.  Her obstetrical history is significant for normal SVD in 2018. Patient does intend to breast feed. Pregnancy history fully reviewed.  Patient reports no complaints.     HISTORY: OB History  Gravida Para Term Preterm AB Living  3 1 1  0 1 1  SAB TAB Ectopic Multiple Live Births  0 0 0 0 1    # Outcome Date GA Lbr Len/2nd Weight Sex Delivery Anes PTL Lv  3 Current           2 AB 03/07/19 187w0d  SAB     1 Term 06/11/17 3896w3d:17 / 00:17 5 lb 4.7 oz (2.401 kg) F Vag-Spont EPI  LIV     Name: Cassady,GIRL Scotlynn     Apgar1: 8  [place]    She has not received a pap smear prior to today  Past Medical History:  Diagnosis Date  . Asthma   . Seasonal allergies    Past Surgical History:  Procedure Laterality Date  . NO PAST SURGERIES     Family History  Problem Relation Age of Onset  . Migraines Mother    Social History   Tobacco Use  . Smoking status: Never Smoker  . Smokeless tobacco: Never Used  Substance Use Topics  . Alcohol use: Not Currently  . Drug use: No   No Known Allergies Current Outpatient Medications on File Prior to Visit  Medication Sig Dispense Refill  . acetaminophen (TYLENOL) 500 MG tablet Take 500 mg by mouth every 6 (six) hours as needed.    . aMarland Kitchenbuterol (PROVENTIL HFA;VENTOLIN HFA) 108 (90 Base) MCG/ACT inhaler Inhale 2 puffs into the lungs every 6 (six) hours as needed. Wheezing or shortness of breath 1 Inhaler 0  . aspirin-acetaminophen-caffeine (EXCEDRIN MIGRAINE) 250-250-65 MG tablet Take 1 tablet by mouth every 6 (six) hours as needed for headache.    . Blood Pressure Monitoring (BLOOD PRESSURE KIT) DEVI 1 Device by Does not apply route as needed. 1 Device 0  . Fexofenadine HCl (ALLEGRA PO) Take 1 tablet by mouth daily.     . Prenat-FeAsp-Meth-FA-DHA w/o A (PRENATE PIXIE) 10-0.6-0.4-200 MG  CAPS Take 1 tablet by mouth daily. 30 capsule 2  . triamcinolone cream (KENALOG) 0.1 % Apply 1 application topically 2 (two) times daily. 30 g 0   No current facility-administered medications on file prior to visit.     Review of Systems Pertinent items noted in HPI and remainder of comprehensive ROS otherwise negative. Physical Exam:   Vitals:   06/09/19 0942  BP: 114/72  Pulse: 71  Weight: 120 lb (54.4 kg)   Fetal Heart Rate (bpm): 160 Uterus:   enlarged, c/w gestational age  Pelvic Exam: Perineum: no hemorrhoids, normal perineum   Vulva: normal external genitalia, no lesions   Vagina:  normal mucosa, normal discharge   Cervix: no lesions and normal, pap smear done.    Adnexa: normal adnexa and no mass, fullness, tenderness   Bony Pelvis: average  System: General: well-developed, well-nourished female in no acute distress   Breasts:  normal appearance, no masses or tenderness bilaterally   Skin: normal coloration and turgor, no rashes   Neurologic: oriented, normal, negative, normal mood   Extremities: normal strength, tone, and muscle mass, ROM of all joints is normal   HEENT PERRLA, extraocular movement intact and  sclera clear   Mouth/Teeth mucous membranes moist, pharynx normal without lesions and dental hygiene good   Neck supple and no masses   Cardiovascular: regular rate and rhythm   Respiratory:  no respiratory distress, normal breath sounds   Abdomen: soft, non-tender; bowel sounds normal; no masses,  no organomegaly     Assessment:    Pregnancy: G3P1011 Patient Active Problem List   Diagnosis Date Noted  . Supervision of other normal pregnancy, antepartum 05/30/2019  . Low vitamin D level 02/11/2017  . Chronic bilateral low back pain without sciatica 02/08/2017  . Mild intermittent asthma without complication 17/53/0104  . Migraine 05/27/2016     Plan:    1. Supervision of other normal pregnancy, antepartum - Welcomed back to practice and introduced  self to patient  - Anticipatory guidance on upcoming appointments  - Educated on COVID and pregnancy with what to expect with prenatal visits and the integration of virtual appointments  - Routine prenatal care - A1c obtained today d/t family hx of diabetes on both sides and african american  - No risk factors for need of aspirin at this time  - Obstetric Panel, Including HIV - Culture, OB Urine - Genetic Screening - Cytology - PAP( [place]) - [place] MFM OB COMP + 14 WK; Future   Initial labs drawn. Continue prenatal vitamins. Genetic Screening discussed, NIPS: ordered. Ultrasound discussed; fetal anatomic survey: ordered. Problem list reviewed and updated. The nature of [facility] with multiple MDs and other Advanced Practice Providers was explained to patient; also emphasized that residents, students are part of our team. Routine obstetric precautions reviewed. Return in about 4 weeks (around 07/07/2019) for ROB-mychart.     Lajean Manes, [place] for [employer] [facility] [employer], [place]

## 2019-06-09 NOTE — Progress Notes (Signed)
NOB Not planned  Pt had miscarriage in 02/2019 Genetic Screening: Desires  Blood Pressure kit has already been ordered pt has been signed up for babyscripts as well per notes.  Last pap: Never    CC: None

## 2019-06-09 NOTE — Patient Instructions (Signed)
Safe Medications in Pregnancy  ° °Acne: °Benzoyl Peroxide °Salicylic Acid ° °Backache/Headache: °Tylenol: 2 regular strength every 4 hours OR °             2 Extra strength every 6 hours ° °Colds/Coughs/Allergies: °Benadryl (alcohol free) 25 mg every 6 hours as needed °Breath right strips °Claritin °Cepacol throat lozenges °Chloraseptic throat spray °Cold-Eeze- up to three times per day °Cough drops, alcohol free °Flonase (by prescription only) °Guaifenesin °Mucinex °Robitussin DM (plain only, alcohol free) °Saline nasal spray/drops °Sudafed (pseudoephedrine) & Actifed ** use only after [redacted] weeks gestation and if you do not have high blood pressure °Tylenol °Vicks Vaporub °Zinc lozenges °Zyrtec  ° °Constipation: °Colace °Ducolax suppositories °Fleet enema °Glycerin suppositories °Metamucil °Milk of magnesia °Miralax °Senokot °Smooth move tea ° °Diarrhea: °Kaopectate °Imodium A-D ° °*NO pepto Bismol ° °Hemorrhoids: °Anusol °Anusol HC °Preparation H °Tucks ° °Indigestion: °Tums °Maalox °Mylanta °Zantac  °Pepcid ° °Insomnia: °Benadryl (alcohol free) 25mg every 6 hours as needed °Tylenol PM °Unisom, no Gelcaps ° °Leg Cramps: °Tums °MagGel ° °Nausea/Vomiting:  °Bonine °Dramamine °Emetrol °Ginger extract °Sea bands °Meclizine  °Nausea medication to take during pregnancy:  °Unisom (doxylamine succinate 25 mg tablets) Take one tablet daily at bedtime. If symptoms are not adequately controlled, the dose can be increased to a maximum recommended dose of two tablets daily (1/2 tablet in the morning, 1/2 tablet mid-afternoon and one at bedtime). °Vitamin B6 100mg tablets. Take one tablet twice a day (up to 200 mg per day). ° °Skin Rashes: °Aveeno products °Benadryl cream or 25mg every 6 hours as needed °Calamine Lotion °1% cortisone cream ° °Yeast infection: °Gyne-lotrimin 7 °Monistat 7 ° ° °**If taking multiple medications, please check labels to avoid duplicating the same active ingredients °**take medication as directed on  the label °** Do not exceed 4000 mg of tylenol in 24 hours °**Do not take medications that contain aspirin or ibuprofen ° ° ° ° °First Trimester of Pregnancy ° °The first trimester of pregnancy is from week 1 until the end of week 13 (months 1 through 3). During this time, your baby will begin to develop inside you. At 6-8 weeks, the eyes and face are formed, and the heartbeat can be seen on ultrasound. At the end of 12 weeks, all the baby's organs are formed. Prenatal care is all the medical care you receive before the birth of your baby. Make sure you get good prenatal care and follow all of your doctor's instructions. °Follow these instructions at home: °Medicines °· Take over-the-counter and prescription medicines only as told by your doctor. Some medicines are safe and some medicines are not safe during pregnancy. °· Take a prenatal vitamin that contains at least 600 micrograms (mcg) of folic acid. °· If you have trouble pooping (constipation), take medicine that will make your stool soft (stool softener) if your doctor approves. °Eating and drinking ° °· Eat regular, healthy meals. °· Your doctor will tell you the amount of weight gain that is right for you. °· Avoid raw meat and uncooked cheese. °· If you feel sick to your stomach (nauseous) or throw up (vomit): °? Eat 4 or 5 small meals a day instead of 3 large meals. °? Try eating a few soda crackers. °? Drink liquids between meals instead of during meals. °· To prevent constipation: °? Eat foods that are high in fiber, like fresh fruits and vegetables, whole grains, and beans. °? Drink enough fluids to keep your pee (urine) clear or pale yellow. °  Activity °· Exercise only as told by your doctor. Stop exercising if you have cramps or pain in your lower belly (abdomen) or low back. °· Do not exercise if it is too hot, too humid, or if you are in a place of great height (high altitude). °· Try to avoid standing for long periods of time. Move your legs often  if you must stand in one place for a long time. °· Avoid heavy lifting. °· Wear low-heeled shoes. Sit and stand up straight. °· You can have sex unless your doctor tells you not to. °Relieving pain and discomfort °· Wear a good support bra if your breasts are sore. °· Take warm water baths (sitz baths) to soothe pain or discomfort caused by hemorrhoids. Use hemorrhoid cream if your doctor says it is okay. °· Rest with your legs raised if you have leg cramps or low back pain. °· If you have puffy, bulging veins (varicose veins) in your legs: °? Wear support hose or compression stockings as told by your doctor. °? Raise (elevate) your feet for 15 minutes, 3-4 times a day. °? Limit salt in your food. °Prenatal care °· Schedule your prenatal visits by the twelfth week of pregnancy. °· Write down your questions. Take them to your prenatal visits. °· Keep all your prenatal visits as told by your doctor. This is important. °Safety °· Wear your seat belt at all times when driving. °· Make a list of emergency phone numbers. The list should include numbers for family, friends, the hospital, and police and fire departments. °General instructions °· Ask your doctor for a referral to a local prenatal class. Begin classes no later than at the start of month 6 of your pregnancy. °· Ask for help if you need counseling or if you need help with nutrition. Your doctor can give you advice or tell you where to go for help. °· Do not use hot tubs, steam rooms, or saunas. °· Do not douche or use tampons or scented sanitary pads. °· Do not cross your legs for long periods of time. °· Avoid all herbs and alcohol. Avoid drugs that are not approved by your doctor. °· Do not use any tobacco products, including cigarettes, chewing tobacco, and electronic cigarettes. If you need help quitting, ask your doctor. You may get counseling or other support to help you quit. °· Avoid cat litter boxes and soil used by cats. These carry germs that can  cause birth defects in the baby and can cause a loss of your baby (miscarriage) or stillbirth. °· Visit your dentist. At home, brush your teeth with a soft toothbrush. Be gentle when you floss. °Contact a doctor if: °· You are dizzy. °· You have mild cramps or pressure in your lower belly. °· You have a nagging pain in your belly area. °· You continue to feel sick to your stomach, you throw up, or you have watery poop (diarrhea). °· You have a bad smelling fluid coming from your vagina. °· You have pain when you pee (urinate). °· You have increased puffiness (swelling) in your face, hands, legs, or ankles. °Get help right away if: °· You have a fever. °· You are leaking fluid from your vagina. °· You have spotting or bleeding from your vagina. °· You have very bad belly cramping or pain. °· You gain or lose weight rapidly. °· You throw up blood. It may look like coffee grounds. °· You are around people who have German measles, fifth disease,   or chickenpox. °· You have a very bad headache. °· You have shortness of breath. °· You have any kind of trauma, such as from a fall or a car accident. °Summary °· The first trimester of pregnancy is from week 1 until the end of week 13 (months 1 through 3). °· To take care of yourself and your unborn baby, you will need to eat healthy meals, take medicines only if your doctor tells you to do so, and do activities that are safe for you and your baby. °· Keep all follow-up visits as told by your doctor. This is important as your doctor will have to ensure that your baby is healthy and growing well. °This information is not intended to replace advice given to you by your health care provider. Make sure you discuss any questions you have with your health care provider. °Document Released: 01/20/2008 Document Revised: 11/24/2018 Document Reviewed: 08/11/2016 °Elsevier Patient Education © 2020 [employer] Inc[employer] ° °

## 2019-06-10 LAB — OBSTETRIC PANEL, INCLUDING HIV
Antibody Screen: NEGATIVE
Basophils Absolute: 0.1 10*3/uL (ref 0.0–0.2)
Basos: 1 %
EOS (ABSOLUTE): 0.2 10*3/uL (ref 0.0–0.4)
Eos: 5 %
HIV Screen 4th Generation wRfx: NONREACTIVE
Hematocrit: 36.2 % (ref 34.0–46.6)
Hemoglobin: 12.2 g/dL (ref 11.1–15.9)
Hepatitis B Surface Ag: NEGATIVE
Immature Grans (Abs): 0 10*3/uL (ref 0.0–0.1)
Immature Granulocytes: 0 %
Lymphocytes Absolute: 1.8 10*3/uL (ref 0.7–3.1)
Lymphs: 37 %
MCH: 32.4 pg (ref 26.6–33.0)
MCHC: 33.7 g/dL (ref 31.5–35.7)
MCV: 96 fL (ref 79–97)
Monocytes Absolute: 0.3 10*3/uL (ref 0.1–0.9)
Monocytes: 6 %
Neutrophils Absolute: 2.5 10*3/uL (ref 1.4–7.0)
Neutrophils: 51 %
Platelets: 313 10*3/uL (ref 150–450)
RBC: 3.77 x10E6/uL (ref 3.77–5.28)
RDW: 11.6 % — ABNORMAL LOW (ref 11.7–15.4)
RPR Ser Ql: NONREACTIVE
Rh Factor: POSITIVE
Rubella Antibodies, IGG: 18.3 index (ref >0.99)
WBC: 4.9 10*3/uL (ref 3.4–10.8)

## 2019-06-10 LAB — HEMOGLOBIN A1C
Est. average glucose Bld gHb Est-mCnc: 88 mg/dL
Hgb A1c MFr Bld: 4.7 % — ABNORMAL LOW (ref 4.8–5.6)

## 2019-06-11 LAB — URINE CULTURE, OB REFLEX: Organism ID, Bacteria: NO GROWTH

## 2019-06-11 LAB — CULTURE, OB URINE

## 2019-06-13 LAB — CYTOLOGY - PAP
Chlamydia: NEGATIVE
Comment: NEGATIVE
Comment: NEGATIVE
Comment: NORMAL
Diagnosis: NEGATIVE
Neisseria Gonorrhea: NEGATIVE
Trichomonas: NEGATIVE

## 2019-06-19 ENCOUNTER — Encounter: Payer: Self-pay | Admitting: Certified Nurse Midwife

## 2019-07-07 ENCOUNTER — Telehealth (INDEPENDENT_AMBULATORY_CARE_PROVIDER_SITE_OTHER): Payer: BC Managed Care – PPO

## 2019-07-07 DIAGNOSIS — Z3A16 16 weeks gestation of pregnancy: Secondary | ICD-10-CM

## 2019-07-07 DIAGNOSIS — Z3480 Encounter for supervision of other normal pregnancy, unspecified trimester: Secondary | ICD-10-CM

## 2019-07-07 DIAGNOSIS — Z3482 Encounter for supervision of other normal pregnancy, second trimester: Secondary | ICD-10-CM

## 2019-07-07 NOTE — Progress Notes (Signed)
I connected with  Kendra Francis on 07/07/19 by a video enabled telemedicine application and verified that I am speaking with the correct person using two identifiers.  MyChart OB.  She is unable to check BP because she is not at home.

## 2019-07-07 NOTE — Progress Notes (Signed)
   TELEHEALTH OBSTETRICS PRENATAL VIRTUAL VIDEO VISIT ENCOUNTER NOTE  Provider location: Center for [facility] at [place]   I connected with Virgina Organ on 07/07/19 at 10:00 AM EST by MyChart Video Encounter and verified that I am speaking with the correct person using two identifiers.  It was of note that provider attempted to verify patient identity with last 4 of SSN, but was not what is on file. However, patient was able to verify current address on file.    I discussed the limitations, risks, security and privacy concerns of performing an evaluation and management service virtually and the availability of in person appointments. I also discussed with the patient that there may be a patient responsible charge related to this service. The patient expressed understanding and agreed to proceed. Subjective:  Kendra Francis is a 23 y.o. G3P1011 at 104w4d being seen today for ongoing prenatal care.  She is currently monitored for the following issues for this low-risk pregnancy and has Migraine; Chronic bilateral low back pain without sciatica; Mild intermittent asthma without complication; Low vitamin D level; and Supervision of other normal pregnancy, antepartum on their problem list.  Patient reports no complaints. She denies issues with nausea and vomiting and reports feeling well overall.  Contractions: Not present. Vag. Bleeding: None.  Movement: Absent. Denies any leaking of fluid.   The following portions of the patient's history were reviewed and updated as appropriate: allergies, current medications, past family history, past medical history, past social history, past surgical history and problem list.   Objective:  There were no vitals filed for this visit.  Fetal Status:     Movement: Absent     General:  Alert, oriented and cooperative. Patient is in no acute distress.  Respiratory: Normal respiratory effort, no problems with respiration noted  Mental  Status: Normal mood and affect. Normal behavior. Normal judgment and thought content.  Rest of physical exam deferred due to type of encounter  Imaging: No results found.  Assessment and Plan:  Pregnancy: G3P1011 at [redacted]w[redacted]d 1. Supervision of other normal pregnancy, antepartum -Anticipatory guidance for upcoming appts. -Informed that next appt will be after ultrasound which is scheduled for Dec 21. -Patient encouraged to call or report to MAU for any issues.   Preterm labor symptoms and general obstetric precautions including but not limited to vaginal bleeding, contractions, leaking of fluid and fetal movement were reviewed in detail with the patient. I discussed the assessment and treatment plan with the patient. The patient was provided an opportunity to ask questions and all were answered. The patient agreed with the plan and demonstrated an understanding of the instructions. The patient was advised to call back or seek an in-person office evaluation/go to [facility] at [facility] for any urgent or concerning symptoms. Please refer to After Visit Summary for other counseling recommendations.   I provided 6 minutes of face-to-face time during this encounter.  Return in about 4 weeks (around 08/04/2019).  Future Appointments  Date Time Provider [place]  08/07/2019  2:30 PM WH-MFC [place] 1 WH-MFCUS MFC-US    Nahiem Dredge L Felix Meras, CNM Center for [facility], [place]

## 2019-07-24 ENCOUNTER — Ambulatory Visit (HOSPITAL_COMMUNITY): Payer: BC Managed Care – PPO

## 2019-08-04 ENCOUNTER — Telehealth: Payer: BC Managed Care – PPO | Admitting: Medical

## 2019-08-07 ENCOUNTER — Other Ambulatory Visit: Payer: Self-pay

## 2019-08-07 ENCOUNTER — Ambulatory Visit (HOSPITAL_COMMUNITY)
Admission: RE | Admit: 2019-08-07 | Discharge: 2019-08-07 | Disposition: A | Discharge status: Home / Self Care | Payer: BC Managed Care – PPO | Source: Ambulatory Visit | Attending: Obstetrics and Gynecology | Admitting: Obstetrics and Gynecology

## 2019-08-07 DIAGNOSIS — Z3480 Encounter for supervision of other normal pregnancy, unspecified trimester: Secondary | ICD-10-CM | POA: Insufficient documentation

## 2019-08-07 DIAGNOSIS — Z363 Encounter for antenatal screening for malformations: Secondary | ICD-10-CM | POA: Diagnosis not present

## 2019-08-07 DIAGNOSIS — Z3687 Encounter for antenatal screening for uncertain dates: Secondary | ICD-10-CM | POA: Diagnosis not present

## 2019-08-07 DIAGNOSIS — Z3A20 20 weeks gestation of pregnancy: Secondary | ICD-10-CM

## 2019-08-09 ENCOUNTER — Telehealth (INDEPENDENT_AMBULATORY_CARE_PROVIDER_SITE_OTHER): Payer: BC Managed Care – PPO | Admitting: Women's Health

## 2019-08-09 VITALS — BP 118/75

## 2019-08-09 DIAGNOSIS — O99512 Diseases of the respiratory system complicating pregnancy, second trimester: Secondary | ICD-10-CM

## 2019-08-09 DIAGNOSIS — Z3480 Encounter for supervision of other normal pregnancy, unspecified trimester: Secondary | ICD-10-CM

## 2019-08-09 DIAGNOSIS — J4520 Mild intermittent asthma, uncomplicated: Secondary | ICD-10-CM

## 2019-08-09 DIAGNOSIS — Z3A20 20 weeks gestation of pregnancy: Secondary | ICD-10-CM

## 2019-08-09 NOTE — Progress Notes (Signed)
I connected with Kendra Francis. Kendra Francis on 08/09/19 at  3:55 PM EST by: MyChart and verified that I am speaking with the correct person using two identifiers.  Patient is located at home and provider is located at [facility].     The purpose of this virtual visit is to provide medical care while limiting exposure to the novel coronavirus. I discussed the limitations, risks, security and privacy concerns of performing an evaluation and management service by MyChart and the availability of in person appointments. I also discussed with the patient that there may be a patient responsible charge related to this service. By engaging in this virtual visit, you consent to the provision of healthcare.  Additionally, you authorize for your insurance to be billed for the services provided during this visit.  The patient expressed understanding and agreed to proceed.  The following staff members participated in the virtual visit:  Vernice Jefferson, NP    PRENATAL VISIT NOTE  Subjective:  Kendra Francis is a 23 y.o. G3P1011 at [redacted]w[redacted]d  for phone visit for ongoing prenatal care.  She is currently monitored for the following issues for this low-risk pregnancy and has Migraine; Chronic bilateral low back pain without sciatica; Mild intermittent asthma without complication; Low vitamin D level; and Supervision of other normal pregnancy, antepartum on their problem list.  Patient reports no complaints.  Contractions: Not present. Vag. Bleeding: None.  Movement: Present. Denies leaking of fluid.   The following portions of the patient's history were reviewed and updated as appropriate: allergies, current medications, past family history, past medical history, past social history, past surgical history and problem list.   Objective:   Vitals:   08/09/19 1437  BP: 118/75  Self-Obtained  Fetal Status:     Movement: Present     Assessment and Plan:  Pregnancy: G3P1011 at [redacted]w[redacted]d  1. Supervision of other normal  pregnancy, antepartum - next visit virtual, lab visit scheduled for AFP - 0lbs weight gain this pregnancy - discussed healthy weight gain in pregnancy, information given - pt declines referral to nutrition at this time  2. Mild intermittent asthma without complication -pt reports has rescue inhaler at home  Preterm labor symptoms and general obstetric precautions including but not limited to vaginal bleeding, contractions, leaking of fluid and fetal movement were reviewed in detail with the patient. I discussed the assessment and treatment plan with the patient. The patient was provided an opportunity to ask questions and all were answered. The patient agreed with the plan and demonstrated an understanding of the instructions. The patient was advised to call back or seek an in-person office evaluation/go to MAU at [facility] for any urgent or concerning symptoms.  Return in about 4 weeks (around 09/06/2019) for virtual ROB, needs lab visit.  Future Appointments  Date Time Provider [place]  08/09/2019  3:55 PM Renelda Kilian, Gerrie Nordmann, NP [facility] None    Time spent on virtual visit: 10 minutes  [name], NP

## 2019-08-09 NOTE — Patient Instructions (Addendum)
Maternity Assessment Unit (MAU)  The Maternity Assessment Unit (MAU) is located at [facility]. The address is: [address], [place] [address], [place], [place] [id number]. Please see map below for additional directions.    The Maternity Assessment Unit is designed to help you during your pregnancy, and for up to 6 weeks after delivery, with any pregnancy- or postpartum-related emergencies, if you think you are in labor, or if your water has broken. For example, if you experience nausea and vomiting, vaginal bleeding, severe abdominal or pelvic pain, elevated blood pressure or other problems related to your pregnancy or postpartum time, please come to the Maternity Assessment Unit for assistance.   Healthy Weight Gain During Pregnancy, Adult A certain amount of weight gain during pregnancy is normal and healthy. How much weight you should gain depends on your overall health and a measurement called BMI (body mass index). BMI is an estimate of your body fat based on your height and weight. You can use an Software engineeronline calculator to figure out your BMI, or you can ask your health care provider to calculate it for you at your next visit. Your recommended pregnancy weight gain is based on your pre-pregnancy BMI. General guidelines for a healthy total weight gain during pregnancy are listed below. If your BMI at or before the start of your pregnancy is:  Less than 18.5 (underweight), you should gain 28-40 lb (13-18 kg).  18.5-24.9 (normal weight), you should gain 25-35 lb (11-16 kg).  25-29.9 (overweight), you should gain 15-25 lb (7-11 kg).  30 or higher (obese), you should gain 11-20 lb (5-9 kg). These ranges vary depending on your individual health. If you are carrying more than one baby (multiples), it may be safe to gain more weight than these recommendations. If you gain less weight than recommended, that may be safe as long as your baby is growing and  developing normally. How can unhealthy weight gain affect me and my baby? Gaining too much weight during pregnancy can lead to pregnancy complications, such as:  A temporary form of diabetes that develops during pregnancy (gestational diabetes).  High blood pressure during pregnancy and protein in your urine (preeclampsia).  High blood pressure during pregnancy without protein in your urine (gestational hypertension).  Your baby having a high weight at birth, which may: ? Raise your risk of having a more difficult delivery or a surgical delivery (cesarean delivery, or C-section). ? Raise your child's risk of developing obesity during childhood. Not gaining enough weight can be life-threatening for your baby, and it may raise your baby's chances of:  Being born early (preterm).  Growing more slowly than normal during pregnancy (growth restriction).  Having a low weight at birth. What actions can I take to gain a healthy amount of weight during pregnancy? General instructions  Keep track of your weight gain during pregnancy.  Take over-the-counter and prescription medicines only as told by your health care provider. Take all prenatal supplements as directed.  Keep all health care visits during pregnancy (prenatal visits). These visits are a good time to discuss your weight gain. Your health care provider will weigh you at each visit to make sure you are gaining a healthy amount of weight. Nutrition   Eat a balanced, nutrient-rich diet. Eat plenty of: ? Fruits and vegetables, such as berries and broccoli. ? Whole grains, such as millet, barley, whole-wheat breads and cereals, and oatmeal. ? Low-fat dairy products or non-dairy products such as  almond milk or rice milk. ? Protein foods, such as lean meat, chicken, eggs, and legumes (such as peas, beans, soybeans, and lentils).  Avoid foods that are fried or have a lot of fat, salt (sodium), or sugar.  Drink enough fluid to keep your  urine pale yellow.  Choose healthy snack and drink options when you are at work or on the go: ? Drink water. Avoid soda, sports drinks, and juices that have added sugar. ? Avoid drinks with caffeine, such as coffee and energy drinks. ? Eat snacks that are high in protein, such as nuts, protein bars, and low-fat yogurt. ? Carry convenient snacks in your purse that do not need refrigeration, such as a pack of trail mix, an apple, or a granola bar.  If you need help improving your diet, work with a health care provider or a diet and nutrition specialist (dietitian). Activity   Exercise regularly, as told by your health care provider. ? If you were active before becoming pregnant, you may be able to continue your regular fitness activities. ? If you were not active before pregnancy, you may gradually build up to exercising for 30 or more minutes on most days of the week. This may include walking, swimming, or yoga.  Ask your health care provider what activities are safe for you. Talk with your health care provider about whether you may need to be excused from certain school or work activities. Where to find more information Learn more about managing your weight gain during pregnancy from:  American Pregnancy Association: www.americanpregnancy.org  U.S. Department of Agriculture pregnancy weight gain calculator: https://ball-collins.biz/ Summary  Too much weight gain during pregnancy can lead to complications for you and your baby.  Find out your pre-pregnancy BMI to determine how much weight gain is healthy for you.  Eat nutritious foods and stay active.  Keep all of your prenatal visits as told by your health care provider. This information is not intended to replace advice given to you by your health care provider. Make sure you discuss any questions you have with your health care provider. Document Released: 04/23/2017 Document Revised: 04/23/2017 Document Reviewed: 04/23/2017 Elsevier  Patient Education  2020 [employer].   Alpha-Fetoprotein Test Why am I having this test? The alpha-fetoprotein test is most commonly used in pregnant women to help screen for birth defects in their unborn baby. It can be used to screen for birth defects, such as chromosome (DNA) abnormalities, problems with the brain or spinal cord, or problems with the abdominal wall of the unborn baby (fetus). The alpha-fetoprotein test may also be done for men or non-pregnant women to check for certain cancers. What is being tested? This test measures the amount of alpha-fetoprotein (AFP) in your blood. AFP is a protein that is made by the liver. Levels can be detected in the mother's blood during pregnancy, starting at 10 weeks and peaking at 16-18 weeks of the pregnancy. Abnormal levels can sometimes be a sign of a birth defect in the baby. Certain cancers can cause a high level of AFP in men and non-pregnant women. What kind of sample is taken?  A blood sample is required for this test. It is usually collected by inserting a needle into a blood vessel. How are the results reported? Your test results will be reported as values. Your health care provider will compare your results to normal ranges that were established after testing a large group of people (reference values). Reference values may vary among labs  and hospitals. For this test, common reference values are:  Adult: Less than 40 ng/mL or less than 40 mcg/L (SI units).  Child younger than 1 year: Less than 30 ng/mL. If you are pregnant, the values may also vary based on how long you have been pregnant. What do the results mean? Results that are above the reference values in pregnant women may indicate the following for the baby:  Neural tube defects, such as abnormalities of the spinal cord or brain.  Abdominal wall defects.  Multiple pregnancy such as twins.  Fetal distress or fetal death. Results that are above the reference values in  men or non-pregnant women may indicate:  Reproductive cancers, such as ovarian or testicular cancer.  Liver cancer.  Liver cell death.  Other types of cancer. Very low levels of AFP in pregnant women may indicate the following for the baby:  Down syndrome.  Fetal death. Talk with your health care provider about what your results mean. Questions to ask your health care provider Ask your health care provider, or the department that is doing the test:  When will my results be ready?  How will I get my results?  What are my treatment options?  What other tests do I need?  What are my next steps? Summary  The alpha-fetoprotein test is done on pregnant women to help screen for birth defects in their unborn baby.  Certain cancers can cause a high level of AFP in men and non-pregnant women.  For this test, a blood sample is usually collected by inserting a needle into a blood vessel.  Talk with your health care provider about what your results mean. This information is not intended to replace advice given to you by your health care provider. Make sure you discuss any questions you have with your health care provider. Document Released: 08/27/2004 Document Revised: 07/16/2017 Document Reviewed: 03/09/2017 Elsevier Patient Education  2020 [employer].     Second Trimester of Pregnancy The second trimester is from week 14 through week 27 (months 4 through 6). The second trimester is often a time when you feel your best. Your body has adjusted to being pregnant, and you begin to feel better physically. Usually, morning sickness has lessened or quit completely, you may have more energy, and you may have an increase in appetite. The second trimester is also a time when the fetus is growing rapidly. At the end of the sixth month, the fetus is about 9 inches long and weighs about 1 pounds. You will likely begin to feel the baby move (quickening) between 16 and 20 weeks of  pregnancy. Body changes during your second trimester Your body continues to go through many changes during your second trimester. The changes vary from woman to woman.  Your weight will continue to increase. You will notice your lower abdomen bulging out.  You may begin to get stretch marks on your hips, abdomen, and breasts.  You may develop headaches that can be relieved by medicines. The medicines should be approved by your health care provider.  You may urinate more often because the fetus is pressing on your bladder.  You may develop or continue to have heartburn as a result of your pregnancy.  You may develop constipation because certain hormones are causing the muscles that push waste through your intestines to slow down.  You may develop hemorrhoids or swollen, bulging veins (varicose veins).  You may have back pain. This is caused by: ? Weight gain. ?  Pregnancy hormones that are relaxing the joints in your pelvis. ? A shift in weight and the muscles that support your balance.  Your breasts will continue to grow and they will continue to become tender.  Your gums may bleed and may be sensitive to brushing and flossing.  Dark spots or blotches (chloasma, mask of pregnancy) may develop on your face. This will likely fade after the baby is born.  A dark line from your belly button to the pubic area (linea nigra) may appear. This will likely fade after the baby is born.  You may have changes in your hair. These can include thickening of your hair, rapid growth, and changes in texture. Some women also have hair loss during or after pregnancy, or hair that feels dry or thin. Your hair will most likely return to normal after your baby is born. What to expect at prenatal visits During a routine prenatal visit:  You will be weighed to make sure you and the fetus are growing normally.  Your blood pressure will be taken.  Your abdomen will be measured to track your baby's  growth.  The fetal heartbeat will be listened to.  Any test results from the previous visit will be discussed. Your health care provider may ask you:  How you are feeling.  If you are feeling the baby move.  If you have had any abnormal symptoms, such as leaking fluid, bleeding, severe headaches, or abdominal cramping.  If you are using any tobacco products, including cigarettes, chewing tobacco, and electronic cigarettes.  If you have any questions. Other tests that may be performed during your second trimester include:  Blood tests that check for: ? Low iron levels (anemia). ? High blood sugar that affects pregnant women (gestational diabetes) between 34 and 28 weeks. ? Rh antibodies. This is to check for a protein on red blood cells (Rh factor).  Urine tests to check for infections, diabetes, or protein in the urine.  An ultrasound to confirm the proper growth and development of the baby.  An amniocentesis to check for possible genetic problems.  Fetal screens for spina bifida and Down syndrome.  HIV (human immunodeficiency virus) testing. Routine prenatal testing includes screening for HIV, unless you choose not to have this test. Follow these instructions at home: Medicines  Follow your health care provider's instructions regarding medicine use. Specific medicines may be either safe or unsafe to take during pregnancy.  Take a prenatal vitamin that contains at least 600 micrograms (mcg) of folic acid.  If you develop constipation, try taking a stool softener if your health care provider approves. Eating and drinking   Eat a balanced diet that includes fresh fruits and vegetables, whole grains, good sources of protein such as meat, eggs, or tofu, and low-fat dairy. Your health care provider will help you determine the amount of weight gain that is right for you.  Avoid raw meat and uncooked cheese. These carry germs that can cause birth defects in the baby.  If you  have low calcium intake from food, talk to your health care provider about whether you should take a daily calcium supplement.  Limit foods that are high in fat and processed sugars, such as fried and sweet foods.  To prevent constipation: ? Drink enough fluid to keep your urine clear or pale yellow. ? Eat foods that are high in fiber, such as fresh fruits and vegetables, whole grains, and beans. Activity  Exercise only as directed by your health care  provider. Most women can continue their usual exercise routine during pregnancy. Try to exercise for 30 minutes at least 5 days a week. Stop exercising if you experience uterine contractions.  Avoid heavy lifting, wear low heel shoes, and practice good posture.  A sexual relationship may be continued unless your health care provider directs you otherwise. Relieving pain and discomfort  Wear a good support bra to prevent discomfort from breast tenderness.  Take warm sitz baths to soothe any pain or discomfort caused by hemorrhoids. Use hemorrhoid cream if your health care provider approves.  Rest with your legs elevated if you have leg cramps or low back pain.  If you develop varicose veins, wear support hose. Elevate your feet for 15 minutes, 3-4 times a day. Limit salt in your diet. Prenatal Care  Write down your questions. Take them to your prenatal visits.  Keep all your prenatal visits as told by your health care provider. This is important. Safety  Wear your seat belt at all times when driving.  Make a list of emergency phone numbers, including numbers for family, friends, the hospital, and police and fire departments. General instructions  Ask your health care provider for a referral to a local prenatal education class. Begin classes no later than the beginning of month 6 of your pregnancy.  Ask for help if you have counseling or nutritional needs during pregnancy. Your health care provider can offer advice or refer you to  specialists for help with various needs.  Do not use hot tubs, steam rooms, or saunas.  Do not douche or use tampons or scented sanitary pads.  Do not cross your legs for long periods of time.  Avoid cat litter boxes and soil used by cats. These carry germs that can cause birth defects in the baby and possibly loss of the fetus by miscarriage or stillbirth.  Avoid all smoking, herbs, alcohol, and unprescribed drugs. Chemicals in these products can affect the formation and growth of the baby.  Do not use any products that contain nicotine or tobacco, such as cigarettes and e-cigarettes. If you need help quitting, ask your health care provider.  Visit your dentist if you have not gone yet during your pregnancy. Use a soft toothbrush to brush your teeth and be gentle when you floss. Contact a health care provider if:  You have dizziness.  You have mild pelvic cramps, pelvic pressure, or nagging pain in the abdominal area.  You have persistent nausea, vomiting, or diarrhea.  You have a bad smelling vaginal discharge.  You have pain when you urinate. Get help right away if:  You have a fever.  You are leaking fluid from your vagina.  You have spotting or bleeding from your vagina.  You have severe abdominal cramping or pain.  You have rapid weight gain or weight loss.  You have shortness of breath with chest pain.  You notice sudden or extreme swelling of your face, hands, ankles, feet, or legs.  You have not felt your baby move in over an hour.  You have severe headaches that do not go away when you take medicine.  You have vision changes. Summary  The second trimester is from week 14 through week 27 (months 4 through 6). It is also a time when the fetus is growing rapidly.  Your body goes through many changes during pregnancy. The changes vary from woman to woman.  Avoid all smoking, herbs, alcohol, and unprescribed drugs. These chemicals affect the formation and  growth your baby.  Do not use any tobacco products, such as cigarettes, chewing tobacco, and e-cigarettes. If you need help quitting, ask your health care provider.  Contact your health care provider if you have any questions. Keep all prenatal visits as told by your health care provider. This is important. This information is not intended to replace advice given to you by your health care provider. Make sure you discuss any questions you have with your health care provider. Document Released: 07/28/2001 Document Revised: 11/25/2018 Document Reviewed: 09/08/2016 Elsevier Patient Education  2020 [employer].

## 2019-09-06 ENCOUNTER — Telehealth (INDEPENDENT_AMBULATORY_CARE_PROVIDER_SITE_OTHER): Payer: BC Managed Care – PPO | Admitting: Obstetrics and Gynecology

## 2019-09-06 ENCOUNTER — Encounter: Payer: Self-pay | Admitting: Obstetrics and Gynecology

## 2019-09-06 VITALS — BP 110/72 | HR 74

## 2019-09-06 DIAGNOSIS — Z3481 Encounter for supervision of other normal pregnancy, first trimester: Secondary | ICD-10-CM | POA: Diagnosis not present

## 2019-09-06 DIAGNOSIS — Z3480 Encounter for supervision of other normal pregnancy, unspecified trimester: Secondary | ICD-10-CM

## 2019-09-06 DIAGNOSIS — Z3A12 12 weeks gestation of pregnancy: Secondary | ICD-10-CM

## 2019-09-06 MED ORDER — MISC. DEVICES MISC: 0 refills | Status: DC

## 2019-09-06 NOTE — Progress Notes (Signed)
S/w pt for virtual visit. Pt reports fetal movement with occasional back pain.

## 2019-09-06 NOTE — Progress Notes (Signed)
   TELEHEALTH OBSTETRICS PRENATAL VIRTUAL VIDEO VISIT ENCOUNTER NOTE  Provider location: [facility] [employer] [facility] [address]   I connected with Kendra Francis on 09/06/19 at 11:00 AM EST by MyChart Video Encounter at home and verified that I am speaking with the correct person using two identifiers.   I discussed the limitations, risks, security and privacy concerns of performing an evaluation and management service virtually and the availability of in person appointments. I also discussed with the patient that there may be a patient responsible charge related to this service. The patient expressed understanding and agreed to proceed. Subjective:  Kendra Francis is a 24 y.o. G3P1011 at [redacted]w[redacted]d being seen today for ongoing prenatal care.  She is currently monitored for the following issues for this low-risk pregnancy and has Migraine; Chronic bilateral low back pain without sciatica; Mild intermittent asthma without complication; Low vitamin D level; and Supervision of other normal pregnancy, antepartum on their problem list.  Patient reports low pelvic pain and back pain.  Contractions: Not present. Vag. Bleeding: None.  Movement: Present. Denies any leaking of fluid.   The following portions of the patient's history were reviewed and updated as appropriate: allergies, current medications, past family history, past medical history, past social history, past surgical history and problem list.   Objective:   Vitals:   09/06/19 1111  BP: 110/72  Pulse: 74    Fetal Status:     Movement: Present     General:  Alert, oriented and cooperative. Patient is in no acute distress.  Respiratory: Normal respiratory effort, no problems with respiration noted  Mental Status: Normal mood and affect. Normal behavior. Normal judgment and thought content.  Rest of physical exam deferred due to type of encounter  Imaging:   Assessment and Plan:  Pregnancy: G3P1011 at [redacted]w[redacted]d  1.  Supervision of other normal pregnancy, antepartum - Reviewed hospital policies regarding visitors, masking, COVID testing, etc. - Sent maternity belt, pt mother will pick up   Preterm labor symptoms and general obstetric precautions including but not limited to vaginal bleeding, contractions, leaking of fluid and fetal movement were reviewed in detail with the patient. I discussed the assessment and treatment plan with the patient. The patient was provided an opportunity to ask questions and all were answered. The patient agreed with the plan and demonstrated an understanding of the instructions. The patient was advised to call back or seek an in-person office evaluation/go to [facility] at [facility] for any urgent or concerning symptoms. Please refer to After Visit Summary for other counseling recommendations.   I provided 22 minutes of face-to-face time during this encounter.  Return in about 4 weeks (around 10/04/2019) for low OB, 2 hr GTT, in person, 3rd trim labs.  No future appointments.  Conan Bowens, MD [facility], [facility]

## 2019-09-06 NOTE — Addendum Note (Signed)
Addended by: Natale Milch D on: 09/06/2019 01:20 PM   Modules accepted: Orders

## 2019-10-04 ENCOUNTER — Encounter: Payer: BC Managed Care – PPO | Admitting: Advanced Practice Midwife

## 2019-10-04 ENCOUNTER — Other Ambulatory Visit: Payer: BC Managed Care – PPO

## 2020-01-26 ENCOUNTER — Telehealth: Payer: Self-pay

## 2020-01-26 ENCOUNTER — Other Ambulatory Visit: Payer: Self-pay

## 2020-01-26 DIAGNOSIS — J4520 Mild intermittent asthma, uncomplicated: Secondary | ICD-10-CM

## 2020-01-26 MED ORDER — ALBUTEROL SULFATE HFA 108 (90 BASE) MCG/ACT IN AERS: 2.0000 | INHALATION_SPRAY | Freq: Four times a day (QID) | RESPIRATORY_TRACT | 0 refills | Status: DC | PRN

## 2020-01-26 NOTE — Telephone Encounter (Signed)
Ok to give one refill but she needs a visit to discuss asthma. I have not seen her for this since 2017

## 2020-01-26 NOTE — Telephone Encounter (Signed)
Pt. Called stating she needs a refill on her Albuterol inhaler to the [facility] [employer] on [address] she wasn't sure if she needs to come in for an apt. Last apt was 05/31/19.

## 2020-01-26 NOTE — Telephone Encounter (Signed)
I got her scheduled for an apt. To discuss asthma 03/21/20 and sent in one refill on the inhaler.

## 2020-02-12 ENCOUNTER — Other Ambulatory Visit: Payer: Self-pay | Admitting: Family Medicine

## 2020-02-12 DIAGNOSIS — J4520 Mild intermittent asthma, uncomplicated: Secondary | ICD-10-CM

## 2020-02-12 NOTE — Telephone Encounter (Signed)
This was already filled this month 01/26/20

## 2020-03-21 ENCOUNTER — Ambulatory Visit: Payer: BC Managed Care – PPO | Admitting: Family Medicine

## 2020-03-27 DIAGNOSIS — H1045 Other chronic allergic conjunctivitis: Secondary | ICD-10-CM | POA: Diagnosis not present

## 2020-03-27 DIAGNOSIS — H5203 Hypermetropia, bilateral: Secondary | ICD-10-CM | POA: Diagnosis not present

## 2020-03-28 ENCOUNTER — Encounter: Payer: Self-pay | Admitting: Family Medicine

## 2020-03-28 ENCOUNTER — Other Ambulatory Visit: Payer: Self-pay

## 2020-03-28 ENCOUNTER — Ambulatory Visit: Payer: BC Managed Care – PPO | Admitting: Family Medicine

## 2020-03-28 VITALS — BP 120/68 | HR 67 | Resp 16 | Wt 127.4 lb

## 2020-03-28 DIAGNOSIS — J4520 Mild intermittent asthma, uncomplicated: Secondary | ICD-10-CM | POA: Diagnosis not present

## 2020-03-28 NOTE — Progress Notes (Signed)
   Subjective:    Patient ID: Kendra Francis, female    DOB: 04-30-1996, 24 y.o.   MRN: [id number]  HPI Chief Complaint  Patient presents with  . follow-up    follow-up on asthma   Here for a follow up on asthma. I have not seen her since she became pregnant last year.  History of asthma since childhood.  Triggers are weather related. States she and her family are moving to [place]. She delivered her 9 month old son vaginally and states her asthma did not get worse during pregnancy.  States she has not had to use her albuterol inhaler in the past month.   No other concerns today.   Denies fever, chills, chest pain, palpitations, shortness of breath, cough, wheezing.   Reviewed allergies, medications, past medical, surgical, family, and social history.    Review of Systems Pertinent positives and negatives in the history of present illness.     Objective:   Physical Exam BP 120/68   Pulse 67   Resp 16   Wt 127 lb 6.4 oz (57.8 kg)   SpO2 99%   Breastfeeding Yes   BMI 23.30 kg/m   Alert and in no distress. Cardiac exam shows a regular sinus rhythm without murmurs or gallops. Lungs are clear to auscultation. Skin is warm and dry.       Assessment & Plan:  Mild intermittent asthma without complication  Pleasant young female here today for asthma check. She is doing well and has not needed her albuterol over the past month but does have a refill if needed. She is moving to [place] and will establish with a new PCP. I wished her and her family much success.

## 2020-09-17 ENCOUNTER — Telehealth: Payer: Self-pay | Admitting: Family Medicine

## 2020-09-17 DIAGNOSIS — J4520 Mild intermittent asthma, uncomplicated: Secondary | ICD-10-CM

## 2020-09-17 NOTE — Telephone Encounter (Signed)
Ok to give her one refill. I encourage her to establish with a PCP in [place] for future refills or return for an in office visit next month.

## 2020-09-17 NOTE — Telephone Encounter (Signed)
Pt called and is requesting a refill for her albuterol inhaler please send to the [facility]  [address] [place], [place] [address]

## 2020-09-18 MED ORDER — ALBUTEROL SULFATE HFA 108 (90 BASE) MCG/ACT IN AERS: 2.0000 | INHALATION_SPRAY | Freq: Four times a day (QID) | RESPIRATORY_TRACT | 0 refills | Status: AC | PRN

## 2020-09-18 NOTE — Telephone Encounter (Signed)
Kendra Francis told patient when she called back that she can not get any more refills

## 2020-09-18 NOTE — Telephone Encounter (Signed)
Called and left message for pt to call me back. I have refilled med to pharmacy

## 2020-10-24 DIAGNOSIS — T7849XA Other allergy, initial encounter: Secondary | ICD-10-CM | POA: Diagnosis not present

## 2020-10-24 DIAGNOSIS — J4520 Mild intermittent asthma, uncomplicated: Secondary | ICD-10-CM | POA: Diagnosis not present

## 2020-11-22 ENCOUNTER — Encounter: Payer: Self-pay | Admitting: Internal Medicine

## 2021-03-25 ENCOUNTER — Other Ambulatory Visit: Payer: Self-pay | Admitting: Family Medicine

## 2021-03-25 DIAGNOSIS — J4520 Mild intermittent asthma, uncomplicated: Secondary | ICD-10-CM

## 2021-03-25 NOTE — Telephone Encounter (Signed)
Pt not longer a pt here

## 2021-03-27 DIAGNOSIS — T7849XA Other allergy, initial encounter: Secondary | ICD-10-CM | POA: Diagnosis not present

## 2021-03-27 DIAGNOSIS — J4520 Mild intermittent asthma, uncomplicated: Secondary | ICD-10-CM | POA: Diagnosis not present

## 2021-03-27 DIAGNOSIS — Z3002 Counseling and instruction in natural family planning to avoid pregnancy: Secondary | ICD-10-CM | POA: Diagnosis not present

## 2021-07-28 DIAGNOSIS — N3000 Acute cystitis without hematuria: Secondary | ICD-10-CM | POA: Diagnosis not present

## 2021-10-06 DIAGNOSIS — Z3A01 Less than 8 weeks gestation of pregnancy: Secondary | ICD-10-CM | POA: Diagnosis not present

## 2021-10-06 DIAGNOSIS — Z124 Encounter for screening for malignant neoplasm of cervix: Secondary | ICD-10-CM | POA: Diagnosis not present

## 2021-10-06 DIAGNOSIS — Z3201 Encounter for pregnancy test, result positive: Secondary | ICD-10-CM | POA: Diagnosis not present

## 2021-10-09 DIAGNOSIS — Z3201 Encounter for pregnancy test, result positive: Secondary | ICD-10-CM | POA: Diagnosis not present

## 2021-10-24 DIAGNOSIS — Z369 Encounter for antenatal screening, unspecified: Secondary | ICD-10-CM | POA: Diagnosis not present

## 2021-10-24 DIAGNOSIS — O09299 Supervision of pregnancy with other poor reproductive or obstetric history, unspecified trimester: Secondary | ICD-10-CM | POA: Diagnosis not present

## 2021-10-24 DIAGNOSIS — Z3490 Encounter for supervision of normal pregnancy, unspecified, unspecified trimester: Secondary | ICD-10-CM | POA: Diagnosis not present

## 2021-11-03 DIAGNOSIS — O368390 Maternal care for abnormalities of the fetal heart rate or rhythm, unspecified trimester, not applicable or unspecified: Secondary | ICD-10-CM | POA: Diagnosis not present

## 2021-11-03 DIAGNOSIS — Z3A12 12 weeks gestation of pregnancy: Secondary | ICD-10-CM | POA: Diagnosis not present

## 2021-11-03 DIAGNOSIS — O09299 Supervision of pregnancy with other poor reproductive or obstetric history, unspecified trimester: Secondary | ICD-10-CM | POA: Diagnosis not present

## 2021-12-02 DIAGNOSIS — O09299 Supervision of pregnancy with other poor reproductive or obstetric history, unspecified trimester: Secondary | ICD-10-CM | POA: Diagnosis not present

## 2021-12-02 DIAGNOSIS — K5901 Slow transit constipation: Secondary | ICD-10-CM | POA: Diagnosis not present

## 2021-12-02 DIAGNOSIS — Z3A16 16 weeks gestation of pregnancy: Secondary | ICD-10-CM | POA: Diagnosis not present

## 2021-12-02 DIAGNOSIS — Z369 Encounter for antenatal screening, unspecified: Secondary | ICD-10-CM | POA: Diagnosis not present

## 2021-12-15 DIAGNOSIS — Z369 Encounter for antenatal screening, unspecified: Secondary | ICD-10-CM | POA: Diagnosis not present

## 2021-12-31 DIAGNOSIS — Z369 Encounter for antenatal screening, unspecified: Secondary | ICD-10-CM | POA: Diagnosis not present

## 2021-12-31 DIAGNOSIS — Z3689 Encounter for other specified antenatal screening: Secondary | ICD-10-CM | POA: Diagnosis not present

## 2021-12-31 DIAGNOSIS — Z3A20 20 weeks gestation of pregnancy: Secondary | ICD-10-CM | POA: Diagnosis not present

## 2022-01-06 DIAGNOSIS — Z3A21 21 weeks gestation of pregnancy: Secondary | ICD-10-CM | POA: Diagnosis not present

## 2022-01-06 DIAGNOSIS — O09292 Supervision of pregnancy with other poor reproductive or obstetric history, second trimester: Secondary | ICD-10-CM | POA: Diagnosis not present

## 2022-02-09 DIAGNOSIS — Z369 Encounter for antenatal screening, unspecified: Secondary | ICD-10-CM | POA: Diagnosis not present

## 2022-02-09 DIAGNOSIS — Z3A26 26 weeks gestation of pregnancy: Secondary | ICD-10-CM | POA: Diagnosis not present

## 2022-02-09 DIAGNOSIS — O26899 Other specified pregnancy related conditions, unspecified trimester: Secondary | ICD-10-CM | POA: Diagnosis not present

## 2022-02-09 DIAGNOSIS — M79622 Pain in left upper arm: Secondary | ICD-10-CM | POA: Diagnosis not present

## 2022-02-09 DIAGNOSIS — M25512 Pain in left shoulder: Secondary | ICD-10-CM | POA: Diagnosis not present

## 2022-02-09 DIAGNOSIS — R079 Chest pain, unspecified: Secondary | ICD-10-CM | POA: Diagnosis not present

## 2022-02-09 DIAGNOSIS — Z3A00 Weeks of gestation of pregnancy not specified: Secondary | ICD-10-CM | POA: Diagnosis not present

## 2022-02-09 DIAGNOSIS — O99891 Other specified diseases and conditions complicating pregnancy: Secondary | ICD-10-CM | POA: Diagnosis not present

## 2022-02-09 DIAGNOSIS — R0789 Other chest pain: Secondary | ICD-10-CM | POA: Diagnosis not present

## 2022-02-09 DIAGNOSIS — O26892 Other specified pregnancy related conditions, second trimester: Secondary | ICD-10-CM | POA: Diagnosis not present

## 2022-02-10 DIAGNOSIS — Z3490 Encounter for supervision of normal pregnancy, unspecified, unspecified trimester: Secondary | ICD-10-CM | POA: Diagnosis not present

## 2022-02-11 DIAGNOSIS — Z331 Pregnant state, incidental: Secondary | ICD-10-CM | POA: Diagnosis not present

## 2022-02-11 DIAGNOSIS — R0789 Other chest pain: Secondary | ICD-10-CM | POA: Diagnosis not present

## 2022-02-13 DIAGNOSIS — Z3689 Encounter for other specified antenatal screening: Secondary | ICD-10-CM | POA: Diagnosis not present

## 2022-02-13 DIAGNOSIS — Z3A26 26 weeks gestation of pregnancy: Secondary | ICD-10-CM | POA: Diagnosis not present

## 2022-02-13 DIAGNOSIS — Z3490 Encounter for supervision of normal pregnancy, unspecified, unspecified trimester: Secondary | ICD-10-CM | POA: Diagnosis not present

## 2022-02-19 DIAGNOSIS — Z113 Encounter for screening for infections with a predominantly sexual mode of transmission: Secondary | ICD-10-CM | POA: Diagnosis not present

## 2022-02-19 DIAGNOSIS — O09292 Supervision of pregnancy with other poor reproductive or obstetric history, second trimester: Secondary | ICD-10-CM | POA: Diagnosis not present

## 2022-02-19 DIAGNOSIS — K219 Gastro-esophageal reflux disease without esophagitis: Secondary | ICD-10-CM | POA: Diagnosis not present

## 2022-02-19 DIAGNOSIS — O09299 Supervision of pregnancy with other poor reproductive or obstetric history, unspecified trimester: Secondary | ICD-10-CM | POA: Diagnosis not present

## 2022-02-19 DIAGNOSIS — Z3A27 27 weeks gestation of pregnancy: Secondary | ICD-10-CM | POA: Diagnosis not present

## 2022-02-28 DIAGNOSIS — O368131 Decreased fetal movements, third trimester, fetus 1: Secondary | ICD-10-CM | POA: Diagnosis not present

## 2022-02-28 DIAGNOSIS — O4193X0 Disorder of amniotic fluid and membranes, unspecified, third trimester, not applicable or unspecified: Secondary | ICD-10-CM | POA: Diagnosis not present

## 2022-02-28 DIAGNOSIS — Z369 Encounter for antenatal screening, unspecified: Secondary | ICD-10-CM | POA: Diagnosis not present

## 2022-02-28 DIAGNOSIS — Z3A28 28 weeks gestation of pregnancy: Secondary | ICD-10-CM | POA: Diagnosis not present

## 2022-03-04 DIAGNOSIS — O26842 Uterine size-date discrepancy, second trimester: Secondary | ICD-10-CM | POA: Diagnosis not present

## 2022-03-04 DIAGNOSIS — O09292 Supervision of pregnancy with other poor reproductive or obstetric history, second trimester: Secondary | ICD-10-CM | POA: Diagnosis not present

## 2022-03-04 DIAGNOSIS — Z3A27 27 weeks gestation of pregnancy: Secondary | ICD-10-CM | POA: Diagnosis not present

## 2022-03-12 DIAGNOSIS — O09293 Supervision of pregnancy with other poor reproductive or obstetric history, third trimester: Secondary | ICD-10-CM | POA: Diagnosis not present

## 2022-03-12 DIAGNOSIS — O09299 Supervision of pregnancy with other poor reproductive or obstetric history, unspecified trimester: Secondary | ICD-10-CM | POA: Diagnosis not present

## 2022-03-12 DIAGNOSIS — K219 Gastro-esophageal reflux disease without esophagitis: Secondary | ICD-10-CM | POA: Diagnosis not present

## 2022-03-12 DIAGNOSIS — Z3A30 30 weeks gestation of pregnancy: Secondary | ICD-10-CM | POA: Diagnosis not present

## 2022-03-25 DIAGNOSIS — O09293 Supervision of pregnancy with other poor reproductive or obstetric history, third trimester: Secondary | ICD-10-CM | POA: Diagnosis not present

## 2022-04-01 DIAGNOSIS — O26843 Uterine size-date discrepancy, third trimester: Secondary | ICD-10-CM | POA: Diagnosis not present

## 2022-04-01 DIAGNOSIS — O365930 Maternal care for other known or suspected poor fetal growth, third trimester, not applicable or unspecified: Secondary | ICD-10-CM | POA: Diagnosis not present

## 2022-04-01 DIAGNOSIS — Z3A30 30 weeks gestation of pregnancy: Secondary | ICD-10-CM | POA: Diagnosis not present

## 2022-04-02 DIAGNOSIS — Z3A33 33 weeks gestation of pregnancy: Secondary | ICD-10-CM | POA: Diagnosis not present

## 2022-04-02 DIAGNOSIS — O365930 Maternal care for other known or suspected poor fetal growth, third trimester, not applicable or unspecified: Secondary | ICD-10-CM | POA: Diagnosis not present

## 2022-04-02 DIAGNOSIS — O09293 Supervision of pregnancy with other poor reproductive or obstetric history, third trimester: Secondary | ICD-10-CM | POA: Diagnosis not present

## 2022-04-02 DIAGNOSIS — K219 Gastro-esophageal reflux disease without esophagitis: Secondary | ICD-10-CM | POA: Diagnosis not present

## 2022-04-07 DIAGNOSIS — Z8759 Personal history of other complications of pregnancy, childbirth and the puerperium: Secondary | ICD-10-CM | POA: Diagnosis not present

## 2022-04-07 DIAGNOSIS — O359XX9 Maternal care for (suspected) fetal abnormality and damage, unspecified, other fetus: Secondary | ICD-10-CM | POA: Diagnosis not present

## 2022-04-07 DIAGNOSIS — O365931 Maternal care for other known or suspected poor fetal growth, third trimester, fetus 1: Secondary | ICD-10-CM | POA: Diagnosis not present

## 2022-04-07 DIAGNOSIS — Z3A34 34 weeks gestation of pregnancy: Secondary | ICD-10-CM | POA: Diagnosis not present

## 2022-04-14 DIAGNOSIS — O09293 Supervision of pregnancy with other poor reproductive or obstetric history, third trimester: Secondary | ICD-10-CM | POA: Diagnosis not present

## 2022-04-14 DIAGNOSIS — O365990 Maternal care for other known or suspected poor fetal growth, unspecified trimester, not applicable or unspecified: Secondary | ICD-10-CM | POA: Diagnosis not present

## 2022-04-14 DIAGNOSIS — Z3A00 Weeks of gestation of pregnancy not specified: Secondary | ICD-10-CM | POA: Diagnosis not present

## 2022-04-14 DIAGNOSIS — O26843 Uterine size-date discrepancy, third trimester: Secondary | ICD-10-CM | POA: Diagnosis not present

## 2022-04-15 DIAGNOSIS — Z369 Encounter for antenatal screening, unspecified: Secondary | ICD-10-CM | POA: Diagnosis not present

## 2022-04-15 DIAGNOSIS — O09293 Supervision of pregnancy with other poor reproductive or obstetric history, third trimester: Secondary | ICD-10-CM | POA: Diagnosis not present

## 2022-04-16 DIAGNOSIS — O359XX9 Maternal care for (suspected) fetal abnormality and damage, unspecified, other fetus: Secondary | ICD-10-CM | POA: Diagnosis not present

## 2022-04-16 DIAGNOSIS — Z3A35 35 weeks gestation of pregnancy: Secondary | ICD-10-CM | POA: Diagnosis not present

## 2022-04-16 DIAGNOSIS — O365999 Maternal care for other known or suspected poor fetal growth, unspecified trimester, other fetus: Secondary | ICD-10-CM | POA: Diagnosis not present

## 2022-04-16 DIAGNOSIS — O365931 Maternal care for other known or suspected poor fetal growth, third trimester, fetus 1: Secondary | ICD-10-CM | POA: Diagnosis not present

## 2022-04-20 DIAGNOSIS — Z3689 Encounter for other specified antenatal screening: Secondary | ICD-10-CM | POA: Diagnosis not present

## 2022-04-20 DIAGNOSIS — Z369 Encounter for antenatal screening, unspecified: Secondary | ICD-10-CM | POA: Diagnosis not present

## 2022-04-27 DIAGNOSIS — O365930 Maternal care for other known or suspected poor fetal growth, third trimester, not applicable or unspecified: Secondary | ICD-10-CM | POA: Diagnosis not present

## 2022-04-27 DIAGNOSIS — Z3A37 37 weeks gestation of pregnancy: Secondary | ICD-10-CM | POA: Diagnosis not present

## 2022-04-27 DIAGNOSIS — Z369 Encounter for antenatal screening, unspecified: Secondary | ICD-10-CM | POA: Diagnosis not present

## 2022-04-27 DIAGNOSIS — O09293 Supervision of pregnancy with other poor reproductive or obstetric history, third trimester: Secondary | ICD-10-CM | POA: Diagnosis not present

## 2022-04-27 DIAGNOSIS — K219 Gastro-esophageal reflux disease without esophagitis: Secondary | ICD-10-CM | POA: Diagnosis not present

## 2022-04-27 DIAGNOSIS — Z3689 Encounter for other specified antenatal screening: Secondary | ICD-10-CM | POA: Diagnosis not present

## 2022-04-28 DIAGNOSIS — Z8759 Personal history of other complications of pregnancy, childbirth and the puerperium: Secondary | ICD-10-CM | POA: Diagnosis not present

## 2022-04-28 DIAGNOSIS — O365930 Maternal care for other known or suspected poor fetal growth, third trimester, not applicable or unspecified: Secondary | ICD-10-CM | POA: Diagnosis not present

## 2022-04-28 DIAGNOSIS — Z3A37 37 weeks gestation of pregnancy: Secondary | ICD-10-CM | POA: Diagnosis not present

## 2022-04-28 DIAGNOSIS — Z369 Encounter for antenatal screening, unspecified: Secondary | ICD-10-CM | POA: Diagnosis not present

## 2022-04-29 DIAGNOSIS — Z8759 Personal history of other complications of pregnancy, childbirth and the puerperium: Secondary | ICD-10-CM | POA: Diagnosis not present

## 2022-04-29 DIAGNOSIS — Z3A37 37 weeks gestation of pregnancy: Secondary | ICD-10-CM | POA: Diagnosis not present

## 2022-04-29 DIAGNOSIS — O365930 Maternal care for other known or suspected poor fetal growth, third trimester, not applicable or unspecified: Secondary | ICD-10-CM | POA: Diagnosis not present

## 2022-04-29 DIAGNOSIS — Z369 Encounter for antenatal screening, unspecified: Secondary | ICD-10-CM | POA: Diagnosis not present

## 2022-04-29 DIAGNOSIS — Z5329 Procedure and treatment not carried out because of patient's decision for other reasons: Secondary | ICD-10-CM | POA: Diagnosis not present

## 2022-05-01 DIAGNOSIS — Z3482 Encounter for supervision of other normal pregnancy, second trimester: Secondary | ICD-10-CM | POA: Diagnosis not present

## 2022-05-01 DIAGNOSIS — Z3483 Encounter for supervision of other normal pregnancy, third trimester: Secondary | ICD-10-CM | POA: Diagnosis not present

## 2022-05-02 DIAGNOSIS — Z23 Encounter for immunization: Secondary | ICD-10-CM | POA: Diagnosis not present

## 2022-05-02 DIAGNOSIS — Z833 Family history of diabetes mellitus: Secondary | ICD-10-CM | POA: Diagnosis not present

## 2022-05-02 DIAGNOSIS — O99511 Diseases of the respiratory system complicating pregnancy, first trimester: Secondary | ICD-10-CM | POA: Diagnosis not present

## 2022-05-02 DIAGNOSIS — S61211A Laceration without foreign body of left index finger without damage to nail, initial encounter: Secondary | ICD-10-CM | POA: Diagnosis not present

## 2022-05-02 DIAGNOSIS — O9A213 Injury, poisoning and certain other consequences of external causes complicating pregnancy, third trimester: Secondary | ICD-10-CM | POA: Diagnosis not present

## 2022-05-02 DIAGNOSIS — O9A219 Injury, poisoning and certain other consequences of external causes complicating pregnancy, unspecified trimester: Secondary | ICD-10-CM | POA: Diagnosis not present

## 2022-05-02 DIAGNOSIS — Z8759 Personal history of other complications of pregnancy, childbirth and the puerperium: Secondary | ICD-10-CM | POA: Diagnosis not present

## 2022-05-02 DIAGNOSIS — W268XXA Contact with other sharp object(s), not elsewhere classified, initial encounter: Secondary | ICD-10-CM | POA: Diagnosis not present

## 2022-05-02 DIAGNOSIS — Z3A00 Weeks of gestation of pregnancy not specified: Secondary | ICD-10-CM | POA: Diagnosis not present

## 2022-05-02 DIAGNOSIS — Z8249 Family history of ischemic heart disease and other diseases of the circulatory system: Secondary | ICD-10-CM | POA: Diagnosis not present

## 2022-05-02 DIAGNOSIS — J45909 Unspecified asthma, uncomplicated: Secondary | ICD-10-CM | POA: Diagnosis not present

## 2022-05-04 DIAGNOSIS — Z8249 Family history of ischemic heart disease and other diseases of the circulatory system: Secondary | ICD-10-CM | POA: Diagnosis not present

## 2022-05-04 DIAGNOSIS — O365930 Maternal care for other known or suspected poor fetal growth, third trimester, not applicable or unspecified: Secondary | ICD-10-CM | POA: Diagnosis not present

## 2022-05-04 DIAGNOSIS — Z833 Family history of diabetes mellitus: Secondary | ICD-10-CM | POA: Diagnosis not present

## 2022-05-04 DIAGNOSIS — J45909 Unspecified asthma, uncomplicated: Secondary | ICD-10-CM | POA: Diagnosis not present

## 2022-05-04 DIAGNOSIS — Z7982 Long term (current) use of aspirin: Secondary | ICD-10-CM | POA: Diagnosis not present

## 2022-05-04 DIAGNOSIS — O039 Complete or unspecified spontaneous abortion without complication: Secondary | ICD-10-CM | POA: Diagnosis not present

## 2022-05-04 DIAGNOSIS — O9952 Diseases of the respiratory system complicating childbirth: Secondary | ICD-10-CM | POA: Diagnosis not present

## 2022-05-04 DIAGNOSIS — Z3A38 38 weeks gestation of pregnancy: Secondary | ICD-10-CM | POA: Diagnosis not present

## 2022-05-04 DIAGNOSIS — Z79899 Other long term (current) drug therapy: Secondary | ICD-10-CM | POA: Diagnosis not present

## 2022-05-11 DIAGNOSIS — S61211D Laceration without foreign body of left index finger without damage to nail, subsequent encounter: Secondary | ICD-10-CM | POA: Diagnosis not present

## 2022-05-11 DIAGNOSIS — Z4802 Encounter for removal of sutures: Secondary | ICD-10-CM | POA: Diagnosis not present

## 2022-05-11 DIAGNOSIS — J45909 Unspecified asthma, uncomplicated: Secondary | ICD-10-CM | POA: Diagnosis not present

## 2022-05-11 DIAGNOSIS — X58XXXD Exposure to other specified factors, subsequent encounter: Secondary | ICD-10-CM | POA: Diagnosis not present

## 2022-07-22 DIAGNOSIS — Z6828 Body mass index (BMI) 28.0-28.9, adult: Secondary | ICD-10-CM | POA: Diagnosis not present

## 2022-07-22 DIAGNOSIS — N898 Other specified noninflammatory disorders of vagina: Secondary | ICD-10-CM | POA: Diagnosis not present
